# Patient Record
Sex: Female | Born: 1980 | Race: White | Hispanic: No | Marital: Single | Smoking: Never smoker
Health system: Southern US, Community
[De-identification: ages and names within clinical notes are randomized; demographics above are authoritative.]

## PROBLEM LIST (undated history)

## (undated) DIAGNOSIS — R569 Unspecified convulsions: Secondary | ICD-10-CM

## (undated) DIAGNOSIS — F71 Moderate intellectual disabilities: Secondary | ICD-10-CM

## (undated) DIAGNOSIS — F39 Unspecified mood [affective] disorder: Secondary | ICD-10-CM

---
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
## 2004-06-13 ENCOUNTER — Emergency Department (HOSPITAL_COMMUNITY): Admission: EM | Admit: 2004-06-13 | Discharge: 2004-06-13 | Payer: Self-pay | Admitting: Family Medicine

## 2004-06-28 ENCOUNTER — Emergency Department (HOSPITAL_COMMUNITY): Admission: EM | Admit: 2004-06-28 | Discharge: 2004-06-28 | Payer: Self-pay | Admitting: Emergency Medicine

## 2004-07-13 ENCOUNTER — Ambulatory Visit: Payer: Self-pay | Admitting: Internal Medicine

## 2004-08-04 ENCOUNTER — Ambulatory Visit: Payer: Self-pay | Admitting: Internal Medicine

## 2004-08-31 ENCOUNTER — Ambulatory Visit: Payer: Self-pay | Admitting: Internal Medicine

## 2004-10-05 ENCOUNTER — Ambulatory Visit: Payer: Self-pay | Admitting: Internal Medicine

## 2004-11-15 ENCOUNTER — Emergency Department (HOSPITAL_COMMUNITY): Admission: EM | Admit: 2004-11-15 | Discharge: 2004-11-15 | Payer: Self-pay | Admitting: Emergency Medicine

## 2005-05-21 ENCOUNTER — Ambulatory Visit: Payer: Self-pay | Admitting: Internal Medicine

## 2005-05-28 ENCOUNTER — Emergency Department (HOSPITAL_COMMUNITY): Admission: EM | Admit: 2005-05-28 | Discharge: 2005-05-28 | Payer: Self-pay | Admitting: Emergency Medicine

## 2005-05-29 ENCOUNTER — Emergency Department (HOSPITAL_COMMUNITY): Admission: EM | Admit: 2005-05-29 | Discharge: 2005-05-29 | Payer: Self-pay | Admitting: Family Medicine

## 2005-09-24 ENCOUNTER — Ambulatory Visit (HOSPITAL_COMMUNITY): Admission: RE | Admit: 2005-09-24 | Discharge: 2005-09-24 | Payer: Self-pay | Admitting: Obstetrics and Gynecology

## 2009-02-10 ENCOUNTER — Emergency Department (HOSPITAL_BASED_OUTPATIENT_CLINIC_OR_DEPARTMENT_OTHER): Admission: EM | Admit: 2009-02-10 | Discharge: 2009-02-10 | Payer: Self-pay | Admitting: Emergency Medicine

## 2010-03-30 LAB — BASIC METABOLIC PANEL
BUN: 11 mg/dL (ref 6–23)
CO2: 25 mEq/L (ref 19–32)
Calcium: 8.7 mg/dL (ref 8.4–10.5)
Chloride: 108 mEq/L (ref 96–112)
Creatinine, Ser: 0.6 mg/dL (ref 0.4–1.2)
GFR calc Af Amer: >60 mL/min (ref >60)
GFR calc non Af Amer: >60 mL/min (ref >60)
Glucose, Bld: 87 mg/dL (ref 70–99)
Potassium: 3.8 mEq/L (ref 3.5–5.1)
Sodium: 144 mEq/L (ref 135–145)

## 2010-03-30 LAB — LIPASE, BLOOD: Lipase: 92 U/L (ref 23–300)

## 2010-03-30 LAB — CBC
HCT: 40.2 % (ref 36.0–46.0)
Hemoglobin: 13.9 g/dL (ref 12.0–15.0)
MCHC: 34.6 g/dL (ref 30.0–36.0)
MCV: 89.1 fL (ref 78.0–100.0)
Platelets: 259 10*3/uL (ref 150–400)
RBC: 4.51 MIL/uL (ref 3.87–5.11)
RDW: 13.3 % (ref 11.5–15.5)
WBC: 9.2 10*3/uL (ref 4.0–10.5)

## 2010-03-30 LAB — URINALYSIS, ROUTINE W REFLEX MICROSCOPIC
Glucose, UA: NEGATIVE mg/dL
Hgb urine dipstick: NEGATIVE
Ketones, ur: NEGATIVE mg/dL
Nitrite: NEGATIVE
Protein, ur: NEGATIVE mg/dL
Specific Gravity, Urine: 1.029 (ref 1.005–1.030)
Urobilinogen, UA: 1 mg/dL (ref 0.0–1.0)
pH: 5.5 (ref 5.0–8.0)

## 2010-03-30 LAB — DIFFERENTIAL
Basophils Absolute: 0.1 10*3/uL (ref 0.0–0.1)
Basophils Relative: 1 % (ref 0–1)
Eosinophils Absolute: 0 10*3/uL (ref 0.0–0.7)
Eosinophils Relative: 1 % (ref 0–5)
Lymphocytes Relative: 16 % (ref 12–46)
Lymphs Abs: 1.5 10*3/uL (ref 0.7–4.0)
Monocytes Absolute: 0.7 10*3/uL (ref 0.1–1.0)
Monocytes Relative: 8 % (ref 3–12)
Neutro Abs: 6.9 10*3/uL (ref 1.7–7.7)
Neutrophils Relative %: 75 % (ref 43–77)

## 2010-03-30 LAB — HEPATIC FUNCTION PANEL
ALT: 14 U/L (ref 0–35)
AST: 27 U/L (ref 0–37)
Albumin: 4.3 g/dL (ref 3.5–5.2)
Alkaline Phosphatase: 42 U/L (ref 39–117)
Bilirubin, Direct: 0 mg/dL (ref 0.0–0.3)
Indirect Bilirubin: 0.4 mg/dL (ref 0.3–0.9)
Total Bilirubin: 0.4 mg/dL (ref 0.3–1.2)
Total Protein: 7.8 g/dL (ref 6.0–8.3)

## 2010-03-30 LAB — PREGNANCY, URINE: Preg Test, Ur: NEGATIVE

## 2015-11-04 ENCOUNTER — Encounter (HOSPITAL_COMMUNITY): Payer: Self-pay | Admitting: Emergency Medicine

## 2015-11-04 ENCOUNTER — Emergency Department (HOSPITAL_COMMUNITY)
Admission: EM | Admit: 2015-11-04 | Discharge: 2015-11-04 | Disposition: A | Discharge status: Home / Self Care | Payer: No Typology Code available for payment source | Attending: Emergency Medicine | Admitting: Emergency Medicine

## 2015-11-04 ENCOUNTER — Emergency Department (HOSPITAL_COMMUNITY): Payer: No Typology Code available for payment source

## 2015-11-04 DIAGNOSIS — Y939 Activity, unspecified: Secondary | ICD-10-CM | POA: Diagnosis not present

## 2015-11-04 DIAGNOSIS — M549 Dorsalgia, unspecified: Secondary | ICD-10-CM | POA: Diagnosis not present

## 2015-11-04 DIAGNOSIS — Y999 Unspecified external cause status: Secondary | ICD-10-CM | POA: Diagnosis not present

## 2015-11-04 DIAGNOSIS — M542 Cervicalgia: Secondary | ICD-10-CM | POA: Diagnosis present

## 2015-11-04 DIAGNOSIS — Y92410 Unspecified street and highway as the place of occurrence of the external cause: Secondary | ICD-10-CM | POA: Insufficient documentation

## 2015-11-04 HISTORY — DX: Unspecified convulsions: R56.9

## 2015-11-04 MED ORDER — ACETAMINOPHEN 500 MG PO TABS: 500.0000 mg | ORAL_TABLET | Freq: Four times a day (QID) | ORAL | 0 refills | Status: DC | PRN

## 2015-11-04 MED ORDER — IBUPROFEN 800 MG PO TABS
800.0000 mg | ORAL_TABLET | Freq: Once | ORAL | Status: AC
  Administered 2015-11-04: 800 mg via ORAL
  Filled 2015-11-04: qty 1

## 2015-11-04 MED ORDER — CYCLOBENZAPRINE HCL 10 MG PO TABS: 10.0000 mg | ORAL_TABLET | Freq: Two times a day (BID) | ORAL | 0 refills | Status: DC | PRN
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
## 2015-11-04 NOTE — ED Notes (Signed)
Caregiver states that pt normally on 800mg  of ibuprofen and don't think it will help her pain.  Greta DoomBowie, PA made aware.
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
## 2016-09-19 ENCOUNTER — Emergency Department (HOSPITAL_COMMUNITY): Payer: Medicare Other

## 2016-09-19 ENCOUNTER — Observation Stay (HOSPITAL_COMMUNITY)
Admission: EM | Admit: 2016-09-19 | Discharge: 2016-09-20 | Disposition: A | Discharge status: Home / Self Care | Payer: Medicare Other | Attending: Internal Medicine | Admitting: Internal Medicine

## 2016-09-19 ENCOUNTER — Encounter (HOSPITAL_COMMUNITY): Payer: Self-pay

## 2016-09-19 DIAGNOSIS — F71 Moderate intellectual disabilities: Secondary | ICD-10-CM | POA: Diagnosis not present

## 2016-09-19 DIAGNOSIS — F319 Bipolar disorder, unspecified: Secondary | ICD-10-CM | POA: Insufficient documentation

## 2016-09-19 DIAGNOSIS — W2209XA Striking against other stationary object, initial encounter: Secondary | ICD-10-CM | POA: Diagnosis not present

## 2016-09-19 DIAGNOSIS — G40909 Epilepsy, unspecified, not intractable, without status epilepticus: Secondary | ICD-10-CM | POA: Diagnosis not present

## 2016-09-19 DIAGNOSIS — I10 Essential (primary) hypertension: Secondary | ICD-10-CM | POA: Diagnosis not present

## 2016-09-19 DIAGNOSIS — Z820 Family history of epilepsy and other diseases of the nervous system: Secondary | ICD-10-CM | POA: Insufficient documentation

## 2016-09-19 DIAGNOSIS — W1839XA Other fall on same level, initial encounter: Secondary | ICD-10-CM | POA: Insufficient documentation

## 2016-09-19 DIAGNOSIS — Z79899 Other long term (current) drug therapy: Secondary | ICD-10-CM | POA: Insufficient documentation

## 2016-09-19 DIAGNOSIS — S01411A Laceration without foreign body of right cheek and temporomandibular area, initial encounter: Secondary | ICD-10-CM | POA: Diagnosis not present

## 2016-09-19 DIAGNOSIS — S0181XA Laceration without foreign body of other part of head, initial encounter: Secondary | ICD-10-CM

## 2016-09-19 DIAGNOSIS — S0990XA Unspecified injury of head, initial encounter: Secondary | ICD-10-CM

## 2016-09-19 DIAGNOSIS — R569 Unspecified convulsions: Secondary | ICD-10-CM | POA: Diagnosis not present

## 2016-09-19 HISTORY — DX: Unspecified mood (affective) disorder: F39

## 2016-09-19 HISTORY — DX: Moderate intellectual disabilities: F71

## 2016-09-19 LAB — CBC WITH DIFFERENTIAL/PLATELET
Basophils Absolute: 0.1 10*3/uL (ref 0.0–0.1)
Basophils Relative: 1 %
Eosinophils Absolute: 0.1 10*3/uL (ref 0.0–0.7)
Eosinophils Relative: 1 %
HCT: 42.1 % (ref 36.0–46.0)
Hemoglobin: 14.4 g/dL (ref 12.0–15.0)
LYMPHS ABS: 3 10*3/uL (ref 0.7–4.0)
Lymphocytes Relative: 27 %
MCH: 28.5 pg (ref 26.0–34.0)
MCHC: 34.2 g/dL (ref 30.0–36.0)
MCV: 83.4 fL (ref 78.0–100.0)
Monocytes Absolute: 0.8 10*3/uL (ref 0.1–1.0)
Monocytes Relative: 7 %
NEUTROS ABS: 7 10*3/uL (ref 1.7–7.7)
Neutrophils Relative %: 64 %
Platelets: 291 10*3/uL (ref 150–400)
RBC: 5.05 MIL/uL (ref 3.87–5.11)
RDW: 13.9 % (ref 11.5–15.5)
WBC: 10.9 10*3/uL — ABNORMAL HIGH (ref 4.0–10.5)

## 2016-09-19 LAB — COMPREHENSIVE METABOLIC PANEL
ALT: 21 U/L (ref 14–54)
AST: 20 U/L (ref 15–41)
Albumin: 4.3 g/dL (ref 3.5–5.0)
Alkaline Phosphatase: 54 U/L (ref 38–126)
Anion gap: 9 (ref 5–15)
BUN: 11 mg/dL (ref 6–20)
CO2: 24 mmol/L (ref 22–32)
Calcium: 9.9 mg/dL (ref 8.9–10.3)
Chloride: 109 mmol/L (ref 101–111)
Creatinine, Ser: 0.74 mg/dL (ref 0.44–1.00)
GFR calc Af Amer: >60 mL/min (ref >60)
GFR calc non Af Amer: >60 mL/min (ref >60)
Glucose, Bld: 88 mg/dL (ref 65–99)
Potassium: 3.5 mmol/L (ref 3.5–5.1)
SODIUM: 142 mmol/L (ref 135–145)
Total Bilirubin: 0.3 mg/dL (ref 0.3–1.2)
Total Protein: 7.5 g/dL (ref 6.5–8.1)

## 2016-09-19 LAB — I-STAT BETA HCG BLOOD, ED (MC, WL, AP ONLY): I-stat hCG, quantitative: <5 m[IU]/mL (ref <5)

## 2016-09-19 LAB — ETHANOL: Alcohol, Ethyl (B): <5 mg/dL (ref <5)

## 2016-09-19 LAB — SALICYLATE LEVEL: Salicylate Lvl: <7 mg/dL (ref 2.8–30.0)

## 2016-09-19 MED ORDER — LEVETIRACETAM 500 MG PO TABS
750.0000 mg | ORAL_TABLET | Freq: Two times a day (BID) | ORAL | Status: DC
  Administered 2016-09-20: 09:00:00 750 mg via ORAL
  Filled 2016-09-19 (×2): qty 1

## 2016-09-19 MED ORDER — LORAZEPAM 2 MG/ML IJ SOLN: 2.0000 mg | INTRAMUSCULAR | Status: DC | PRN

## 2016-09-19 MED ORDER — LIDOCAINE-EPINEPHRINE-TETRACAINE (LET) SOLUTION
3.0000 mL | Freq: Once | NASAL | Status: AC
  Administered 2016-09-19: 18:00:00 3 mL via TOPICAL
  Filled 2016-09-19: qty 3

## 2016-09-19 MED ORDER — ONDANSETRON HCL 4 MG PO TABS: 4.0000 mg | ORAL_TABLET | Freq: Four times a day (QID) | ORAL | Status: DC | PRN

## 2016-09-19 MED ORDER — SODIUM CHLORIDE 0.9% FLUSH
3.0000 mL | Freq: Two times a day (BID) | INTRAVENOUS | Status: DC
  Administered 2016-09-19 – 2016-09-20 (×2): 3 mL via INTRAVENOUS

## 2016-09-19 MED ORDER — SODIUM CHLORIDE 0.9% FLUSH: 3.0000 mL | INTRAVENOUS | Status: DC | PRN

## 2016-09-19 MED ORDER — SODIUM CHLORIDE 0.9 % IV SOLN
1000.0000 mg | Freq: Once | INTRAVENOUS | Status: AC
  Administered 2016-09-19: 1000 mg via INTRAVENOUS
  Filled 2016-09-19: qty 10

## 2016-09-19 MED ORDER — FUROSEMIDE 40 MG PO TABS
20.0000 mg | ORAL_TABLET | Freq: Every day | ORAL | Status: DC
  Administered 2016-09-20: 20 mg via ORAL
  Filled 2016-09-19: qty 1

## 2016-09-19 MED ORDER — GEMFIBROZIL 600 MG PO TABS
600.0000 mg | ORAL_TABLET | Freq: Every day | ORAL | Status: DC
  Filled 2016-09-19: qty 1

## 2016-09-19 MED ORDER — LORATADINE 10 MG PO TABS
10.0000 mg | ORAL_TABLET | Freq: Every day | ORAL | Status: DC
  Administered 2016-09-20: 10 mg via ORAL
  Filled 2016-09-19: qty 1

## 2016-09-19 MED ORDER — SODIUM CHLORIDE 0.9 % IV SOLN: 250.0000 mL | INTRAVENOUS | Status: DC | PRN

## 2016-09-19 MED ORDER — BUPIVACAINE-EPINEPHRINE (PF) 0.5% -1:200000 IJ SOLN
10.0000 mL | Freq: Once | INTRAMUSCULAR | Status: AC
  Administered 2016-09-19: 10 mL
  Filled 2016-09-19: qty 30

## 2016-09-19 MED ORDER — ACETAMINOPHEN 500 MG PO TABS: 500.0000 mg | ORAL_TABLET | Freq: Four times a day (QID) | ORAL | Status: DC | PRN

## 2016-09-19 MED ORDER — INFLUENZA VAC SPLIT QUAD 0.5 ML IM SUSY: 0.5000 mL | PREFILLED_SYRINGE | INTRAMUSCULAR | Status: DC

## 2016-09-19 MED ORDER — ENOXAPARIN SODIUM 40 MG/0.4ML ~~LOC~~ SOLN
40.0000 mg | Freq: Every day | SUBCUTANEOUS | Status: DC
  Administered 2016-09-19: 40 mg via SUBCUTANEOUS
  Filled 2016-09-19: qty 0.4

## 2016-09-19 MED ORDER — POLYETHYLENE GLYCOL 3350 17 G PO PACK: 17.0000 g | PACK | Freq: Every day | ORAL | Status: DC | PRN

## 2016-09-19 MED ORDER — HYDROXYZINE HCL 25 MG PO TABS
25.0000 mg | ORAL_TABLET | Freq: Every day | ORAL | Status: DC
  Administered 2016-09-20: 25 mg via ORAL
  Filled 2016-09-19: qty 1

## 2016-09-19 MED ORDER — HYDROXYZINE HCL 25 MG PO TABS
75.0000 mg | ORAL_TABLET | Freq: Every day | ORAL | Status: DC
  Administered 2016-09-19: 75 mg via ORAL
  Filled 2016-09-19: qty 3

## 2016-09-19 MED ORDER — ARIPIPRAZOLE 10 MG PO TABS
10.0000 mg | ORAL_TABLET | Freq: Every day | ORAL | Status: DC
  Administered 2016-09-20: 10 mg via ORAL
  Filled 2016-09-19: qty 1

## 2016-09-19 MED ORDER — PANTOPRAZOLE SODIUM 40 MG PO TBEC
40.0000 mg | DELAYED_RELEASE_TABLET | Freq: Every day | ORAL | Status: DC
  Administered 2016-09-19 – 2016-09-20 (×2): 40 mg via ORAL
  Filled 2016-09-19 (×2): qty 1

## 2016-09-19 MED ORDER — LACOSAMIDE 50 MG PO TABS
150.0000 mg | ORAL_TABLET | Freq: Two times a day (BID) | ORAL | Status: DC
  Administered 2016-09-19 – 2016-09-20 (×2): 150 mg via ORAL
  Filled 2016-09-19 (×2): qty 3

## 2016-09-19 MED ORDER — FLUTICASONE PROPIONATE 50 MCG/ACT NA SUSP
1.0000 | Freq: Every day | NASAL | Status: DC | PRN
  Filled 2016-09-19: qty 16

## 2016-09-19 MED ORDER — LORAZEPAM 2 MG/ML IJ SOLN
INTRAMUSCULAR | Status: AC
  Administered 2016-09-19: 2 mg
  Filled 2016-09-19: qty 1

## 2016-09-19 MED ORDER — ACETAMINOPHEN 325 MG PO TABS: 650.0000 mg | ORAL_TABLET | Freq: Once | ORAL | Status: DC

## 2016-09-19 MED ORDER — ONDANSETRON HCL 4 MG/2ML IJ SOLN: 4.0000 mg | Freq: Four times a day (QID) | INTRAMUSCULAR | Status: DC | PRN

## 2016-09-19 NOTE — ED Notes (Signed)
Pt experienced a seizure in x-ray. EDP came to bedside and ordered  ativan IV which was administered. Seizure activity was witnessed to last about 3 min. Pt is currently in postictal state

## 2016-09-19 NOTE — ED Triage Notes (Signed)
She was seen to have a tonic-clonic seizure whilst shopping at a local retailer with subsequent fall. EMS found pt. To be drowsy and in no distress upon their arrival. A facial lac. At right angle-of-jaw area was also noted by them; and it appeared pt. Struck the edge of a shelf when she fell, thereby causing the lac. She is awake, alert and oriented x 3 and tells me she is "hungry".

## 2016-09-19 NOTE — ED Provider Notes (Signed)
[facility]-EMERGENCY DEPT Provider Note   CSN: [id number] Arrival date & time: 09/19/16  1308     History   Chief Complaint Chief Complaint  Patient presents with  . Seizures  . Fall  . Facial Laceration    HPI Kristi King is a 36 y.o. female.  HPI   Kristi King is a 36 y.o. female, with a history of Moderate intellectual developmental disorder, seizures, and mood disorder, presenting to the ED with reported seizure shortly prior to arrival. Positive urinary incontinence and tongue trauma. Patient states her last seizure was in May 2018. Currently complains of right facial pain and posterior neck pain, both 7/10, vague description, nonradiating. Tameka, job Psychologist, occupational (helps patient with tasks at work, helps her learn tasks, keeps her focused), is at the bedside. States patient was acting normally prior to seizure. Was mopping when she stiffened, fell over, hit her head on a cabinet, and seized on the ground for 35-40 seconds. No noted vomiting. Missed her keppra dose today because it had to be renewed. Patient denies chest pain, shortness of breath, abdominal pain, neuro deficits, or any other complaints.   Past Medical History:  Diagnosis Date  . Moderate intellectual disability   . Mood disorder (HCC)   . Seizures [facility])     Patient Active Problem List   Diagnosis Date Noted  . Seizure (HCC) 09/19/2016    History reviewed. No pertinent surgical history.  OB History    No data available       Home Medications    Prior to Admission medications   Medication Sig Start Date End Date Taking? Authorizing Provider  acetaminophen (TYLENOL) 500 MG tablet Take 1 tablet (500 mg total) by mouth every 6 (six) hours as needed. 11/04/15  Yes Fayrene Helper, PA-C  ARIPiprazole (ABILIFY) 10 MG tablet Take 10 mg by mouth daily. 09/18/16  Yes [provider]  cetirizine (ZYRTEC) 10 MG tablet Take 10 mg by mouth daily. 09/11/16  Yes [provider]  esomeprazole (NEXIUM) 40  MG capsule Take 40 mg by mouth at bedtime. 08/28/16  Yes [provider]  fluticasone (FLONASE) 50 MCG/ACT nasal spray Place 1 spray into the nose daily as needed.   Yes [provider]  furosemide (LASIX) 20 MG tablet Take 20 mg by mouth daily. 09/18/16  Yes [provider]  gemfibrozil (LOPID) 600 MG tablet Take 600 mg by mouth at bedtime. 09/11/16  Yes [provider]  hydrOXYzine (VISTARIL) 25 MG capsule Take 25-75 mg by mouth 2 (two) times daily. 1 qam and 1 qhs 08/19/16  Yes [provider]  levETIRAcetam (KEPPRA) 750 MG tablet Take 750 mg by mouth 2 (two) times daily. 09/19/16  Yes [provider]  Melatonin 10 MG TABS Take 10 mg by mouth at bedtime.   Yes [provider]  Omega-3 Fatty Acids (FISH OIL OMEGA-3) 1000 MG CAPS Take 1,000 mg by mouth 2 (two) times daily.   Yes [provider]  tiZANidine (ZANAFLEX) 4 MG tablet Take 4 mg by mouth 2 (two) times daily. 09/19/16  Yes [provider]  VIMPAT 150 MG TABS Take 150 mg by mouth 2 (two) times daily. 08/21/16  Yes [provider]  Vitamin D, Ergocalciferol, (DRISDOL) 50000 units CAPS capsule Take 1 capsule by mouth once a week. 09/07/16  Yes [provider]    Family History Family History  Problem Relation Age of Onset  . Epilepsy Mother     Social History Social  History  Substance Use Topics  . Smoking status: Never Smoker  . Smokeless tobacco: Never Used  . Alcohol use No     Allergies   Dilantin [phenytoin] and Tegretol [carbamazepine]   Review of Systems Review of Systems  Constitutional: Negative for chills, diaphoresis and fever.  HENT: Positive for facial swelling.   Respiratory: Negative for shortness of breath.   Cardiovascular: Negative for chest pain.  Gastrointestinal: Negative for abdominal pain, nausea and vomiting.  Musculoskeletal: Positive for neck pain.  Skin: Positive for wound.  Neurological: Positive for  seizures. Negative for weakness, numbness and headaches.  All other systems reviewed and are negative.    Physical Exam Updated Vital Signs BP 119/73 (BP Location: Right Arm)   Pulse 85   Temp 97.8 F (36.6 C) (Oral)   Resp 18   SpO2 97%   Physical Exam  Constitutional: She appears well-developed and well-nourished. No distress.  HENT:  Head: Normocephalic.  Tenderness and swelling to right posterior mandible. Jaw opening limited to about 3 cm. Difficulty with jaw opening was corrected following administration of Tylenol. Fully tested following clear maxillofacial CT. Abrasion to the right anterior tongue. No noted laceration or repairable wound. Dentition appears to be intact, and patient agrees.  Eyes: Pupils are equal, round, and reactive to light. Conjunctivae and EOM are normal.  Neck: Neck supple.  Cardiovascular: Normal rate, regular rhythm, normal heart sounds and intact distal pulses.   Pulmonary/Chest: Effort normal and breath sounds normal. No respiratory distress.  Abdominal: Soft. There is no tenderness. There is no guarding.  Genitourinary:  Genitourinary Comments: Urinary incontinence.   Musculoskeletal: She exhibits no edema.  Midline cervical spine tenderness without deformity, step-off, swelling, or crepitus. c-collar in place upon initial exam. No other midline spinal tenderness.   Abrasion to right lateral shoulder. Full range of motion in the bilateral shoulders, elbows, and wrists. Pain with range of motion in the right shoulder and elbow. No noted swelling, crepitus, or deformity.  Normal motor function in the lower extremities.   Lymphadenopathy:    She has no cervical adenopathy.  Neurological: She is alert.  Skin: Skin is warm and dry. Capillary refill takes less than 2 seconds. She is not diaphoretic.  5 cm laceration to the right side of the face. Bleeding controlled prior to arrival. Laceration into the dermis. Does not appear to involve the muscle  layer. 2 cm, superficial-appearing laceration to the right side of the face.  Psychiatric: She has a normal mood and affect. Her behavior is normal.  Nursing note and vitals reviewed.            ED Treatments / Results  Labs (all labs ordered are listed, but only abnormal results are displayed) Labs Reviewed  CBC WITH DIFFERENTIAL/PLATELET - Abnormal; Notable for the following:       Result Value   WBC 10.9 (*)    All other components within normal limits  COMPREHENSIVE METABOLIC PANEL  ETHANOL  SALICYLATE LEVEL  URINALYSIS, ROUTINE W REFLEX MICROSCOPIC  RAPID URINE DRUG SCREEN, HOSP PERFORMED  LEVETIRACETAM LEVEL  HIV ANTIBODY (ROUTINE TESTING)  CBC  CREATININE, SERUM  CBC  BASIC METABOLIC PANEL  CBG MONITORING, ED  I-STAT BETA HCG BLOOD, ED ([facility], [facility], [facility] ONLY)    EKG  EKG Interpretation None       Radiology Dg Shoulder Right  Result Date: 09/19/2016 CLINICAL DATA:  Fall.  Tenderness and pain.  Seizures. EXAM: RIGHT SHOULDER - 2+ VIEW COMPARISON:  None. FINDINGS:  There is no evidence of fracture or dislocation. There is no evidence of arthropathy or other focal bone abnormality. Soft tissues are unremarkable. IMPRESSION: Negative right shoulder radiographs. Electronically Signed   By: Marin Robertshristopher  Mattern M.D.   On: 09/19/2016 16:37   Dg Elbow Complete Right  Result Date: 09/19/2016 CLINICAL DATA:  Fall.  Seizures.  Tenderness and pain. EXAM: RIGHT ELBOW - COMPLETE 3+ VIEW COMPARISON:  None. FINDINGS: Right shoulder scratched at the right elbow is located. No acute fracture is seen. A small elbow effusion is suggested. Soft tissue swelling is noted over the olecranon. IMPRESSION: 1. Probable elbow effusion.  This suggests an occult fracture. 2. No focal fracture is evident. Electronically Signed   By: Marin Robertshristopher  Mattern M.D.   On: 09/19/2016 16:38   Ct Head Wo Contrast  Result Date: 09/19/2016 CLINICAL DATA:  Head trauma after seizure. EXAM: CT HEAD WITHOUT  CONTRAST CT MAXILLOFACIAL WITHOUT CONTRAST CT CERVICAL SPINE WITHOUT CONTRAST TECHNIQUE: Multidetector CT imaging of the head, cervical spine, and maxillofacial structures were performed using the standard protocol without intravenous contrast. Multiplanar CT image reconstructions of the cervical spine and maxillofacial structures were also generated. COMPARISON:  CT head dated dated October 16, 2015. FINDINGS: CT HEAD FINDINGS Brain: No evidence of acute infarction, hemorrhage, hydrocephalus, extra-axial collection or mass lesion/mass effect. Vascular: No hyperdense vessel or unexpected calcification. Skull: Normal. Negative for fracture or focal lesion. Other: None. CT MAXILLOFACIAL FINDINGS Osseous: No fracture or mandibular dislocation. No destructive process. Orbits: Negative. No traumatic or inflammatory finding. Sinuses: Clear. Soft tissues: Negative. CT CERVICAL SPINE FINDINGS Alignment: Normal. Skull base and vertebrae: No acute fracture. No primary bone lesion or focal pathologic process. Soft tissues and spinal canal: No prevertebral fluid or swelling. No visible canal hematoma. Disc levels:  Minimal degenerative changes. Upper chest: Negative. Other: None. IMPRESSION: 1.  No acute intracranial abnormality. 2. No acute facial fracture. 3.  No acute cervical spine fracture. Electronically Signed   By: Obie DredgeWilliam T Derry M.D.   On: 09/19/2016 15:50   Ct Cervical Spine Wo Contrast  Result Date: 09/19/2016 CLINICAL DATA:  Head trauma after seizure. EXAM: CT HEAD WITHOUT CONTRAST CT MAXILLOFACIAL WITHOUT CONTRAST CT CERVICAL SPINE WITHOUT CONTRAST TECHNIQUE: Multidetector CT imaging of the head, cervical spine, and maxillofacial structures were performed using the standard protocol without intravenous contrast. Multiplanar CT image reconstructions of the cervical spine and maxillofacial structures were also generated. COMPARISON:  CT head dated dated October 16, 2015. FINDINGS: CT HEAD FINDINGS Brain: No  evidence of acute infarction, hemorrhage, hydrocephalus, extra-axial collection or mass lesion/mass effect. Vascular: No hyperdense vessel or unexpected calcification. Skull: Normal. Negative for fracture or focal lesion. Other: None. CT MAXILLOFACIAL FINDINGS Osseous: No fracture or mandibular dislocation. No destructive process. Orbits: Negative. No traumatic or inflammatory finding. Sinuses: Clear. Soft tissues: Negative. CT CERVICAL SPINE FINDINGS Alignment: Normal. Skull base and vertebrae: No acute fracture. No primary bone lesion or focal pathologic process. Soft tissues and spinal canal: No prevertebral fluid or swelling. No visible canal hematoma. Disc levels:  Minimal degenerative changes. Upper chest: Negative. Other: None. IMPRESSION: 1.  No acute intracranial abnormality. 2. No acute facial fracture. 3.  No acute cervical spine fracture. Electronically Signed   By: Obie DredgeWilliam T Derry M.D.   On: 09/19/2016 15:50   Ct Maxillofacial Wo Contrast  Result Date: 09/19/2016 CLINICAL DATA:  Head trauma after seizure. EXAM: CT HEAD WITHOUT CONTRAST CT MAXILLOFACIAL WITHOUT CONTRAST CT CERVICAL SPINE WITHOUT CONTRAST TECHNIQUE: Multidetector CT imaging of the head, cervical  spine, and maxillofacial structures were performed using the standard protocol without intravenous contrast. Multiplanar CT image reconstructions of the cervical spine and maxillofacial structures were also generated. COMPARISON:  CT head dated dated October 16, 2015. FINDINGS: CT HEAD FINDINGS Brain: No evidence of acute infarction, hemorrhage, hydrocephalus, extra-axial collection or mass lesion/mass effect. Vascular: No hyperdense vessel or unexpected calcification. Skull: Normal. Negative for fracture or focal lesion. Other: None. CT MAXILLOFACIAL FINDINGS Osseous: No fracture or mandibular dislocation. No destructive process. Orbits: Negative. No traumatic or inflammatory finding. Sinuses: Clear. Soft tissues: Negative. CT CERVICAL SPINE  FINDINGS Alignment: Normal. Skull base and vertebrae: No acute fracture. No primary bone lesion or focal pathologic process. Soft tissues and spinal canal: No prevertebral fluid or swelling. No visible canal hematoma. Disc levels:  Minimal degenerative changes. Upper chest: Negative. Other: None. IMPRESSION: 1.  No acute intracranial abnormality. 2. No acute facial fracture. 3.  No acute cervical spine fracture. Electronically Signed   By: Obie Dredge M.D.   On: 09/19/2016 15:50    Procedures .Marland KitchenLaceration Repair Date/Time: 09/19/2016 7:20 PM Performed by: Anselm Pancoast Authorized by: Anselm Pancoast   Consent:    Consent obtained:  Verbal   Consent given by:  Patient   Risks discussed:  Pain, poor cosmetic result, poor wound healing, need for additional repair and infection Anesthesia (see MAR for exact dosages):    Anesthesia method:  Local infiltration and topical application   Topical anesthetic:  LET   Local anesthetic:  Bupivacaine 0.5% WITH epi Laceration details:    Location:  Face   Face location:  R cheek   Length (cm):  5 Repair type:    Repair type:  Complex Pre-procedure details:    Preparation:  Patient was prepped and draped in usual sterile fashion and imaging obtained to evaluate for foreign bodies Exploration:    Limited defect created (wound extended): yes     Hemostasis achieved with:  LET and epinephrine   Wound exploration: wound explored through full range of motion and entire depth of wound probed and visualized   Treatment:    Area cleansed with:  Betadine and saline   Amount of cleaning:  Extensive   Irrigation solution:  Sterile saline   Irrigation volume:  500   Irrigation method:  Syringe   Debridement:  Moderate   Undermining:  Minimal Subcutaneous repair:    Suture size:  5-0   Suture material:  Vicryl (Rapide)   Suture technique:  Figure eight   Number of sutures:  13 Skin repair:    Repair method:  Tissue adhesive Approximation:     Approximation:  Close Post-procedure details:    Dressing:  Open (no dressing)   Patient tolerance of procedure:  Tolerated well, no immediate complications .Marland KitchenLaceration Repair Date/Time: 09/19/2016 7:00 PM Performed by: Anselm Pancoast Authorized by: Anselm Pancoast   Consent:    Consent obtained:  Verbal   Consent given by:  Patient   Risks discussed:  Infection, need for additional repair, poor wound healing, poor cosmetic result and pain Anesthesia (see MAR for exact dosages):    Anesthesia method:  Local infiltration and topical application   Topical anesthetic:  LET   Local anesthetic:  Bupivacaine 0.5% WITH epi Laceration details:    Location:  Face   Face location:  R cheek   Length (cm):  2 Repair type:    Repair type:  Intermediate Pre-procedure details:    Preparation:  Patient was prepped and draped  in usual sterile fashion and imaging obtained to evaluate for foreign bodies Exploration:    Hemostasis achieved with:  LET and epinephrine   Wound exploration: wound explored through full range of motion and entire depth of wound probed and visualized   Treatment:    Area cleansed with:  Betadine and saline   Amount of cleaning:  Extensive   Irrigation solution:  Sterile saline   Irrigation volume:  100   Irrigation method:  Syringe Subcutaneous repair:    Suture size:  5-0   Suture material:  Vicryl (Rapide)   Suture technique:  Figure eight   Number of sutures:  3 Skin repair:    Repair method:  Tissue adhesive Approximation:    Approximation:  Close Post-procedure details:    Dressing:  Open (no dressing)   Patient tolerance of procedure:  Tolerated well, no immediate complications   (including critical care time)  Medications Ordered in ED Medications  acetaminophen (TYLENOL) tablet 650 mg (0 mg Oral Hold 09/19/16 1630)  ARIPiprazole (ABILIFY) tablet 10 mg (not administered)  loratadine (CLARITIN) tablet 10 mg (not administered)  pantoprazole (PROTONIX) EC  tablet 40 mg (not administered)  fluticasone (FLONASE) 50 MCG/ACT nasal spray 1 spray (not administered)  furosemide (LASIX) tablet 20 mg (not administered)  gemfibrozil (LOPID) tablet 600 mg (not administered)  hydrOXYzine (VISTARIL) capsule 25-75 mg (not administered)  levETIRAcetam (KEPPRA) tablet 750 mg (not administered)  Lacosamide TABS 150 mg (not administered)  acetaminophen (TYLENOL) tablet 500 mg (not administered)  enoxaparin (LOVENOX) injection 40 mg (not administered)  sodium chloride flush (NS) 0.9 % injection 3 mL (not administered)  sodium chloride flush (NS) 0.9 % injection 3 mL (not administered)  0.9 %  sodium chloride infusion (not administered)  polyethylene glycol (MIRALAX / GLYCOLAX) packet 17 g (not administered)  ondansetron (ZOFRAN) tablet 4 mg (not administered)    Or  ondansetron (ZOFRAN) injection 4 mg (not administered)  LORazepam (ATIVAN) injection 2 mg (not administered)  bupivacaine-epinephrine (MARCAINE W/ EPI) 0.5% -1:200000 injection 10 mL (10 mLs Infiltration Given by Other 09/19/16 1950)  LORazepam (ATIVAN) 2 MG/ML injection (2 mg  Given 09/19/16 1616)  levETIRAcetam (KEPPRA) 1,000 mg in sodium chloride 0.9 % 100 mL IVPB (0 mg Intravenous Stopped 09/19/16 1950)  lidocaine-EPINEPHrine-tetracaine (LET) solution (3 mLs Topical Given 09/19/16 1803)     Initial Impression / Assessment and Plan / ED Course  I have reviewed the triage vital signs and the nursing notes.  Pertinent labs & imaging results that were available during my care of the patient were reviewed by me and considered in my medical decision making (see chart for details).  Clinical Course as of Sep 19 2021  Wed Sep 19, 2016  1548 Patient's caregiver, Florene Route, arrived at the bedside. States patient is up to date on her tetanus. Patient can take tylenol for pain management. Prefers no narcotics.   [SJ]  1555 Notified patient having a seizure. Lasted for about 2-3 minutes. Resolved with  Ativan administration.  Patient's caregiver states that patient is very sensitive to missing even one dose of her seizure medications.  [SJ]  1712 Spoke with hospitalist, Dr. Alvino Chapel, who agrees to admit the patient.  [SJ]  1806 Keppra arrived from pharmacy and was started. I wanted to have the Keppra started before attempted the laceration repair due to alleviate some of the risk of the patient having a seizure during the repair. Now that the Keppra has been started, the sequence for laceration repair can be initiated,  including the application of LET.   [SJ]    Clinical Course User Index [SJ] Karson Reede C, PA-C    Patient presents with seizure and subsequent fall and facial injury. Patient had another seizure here in the ED. No acute abnormalities on imaging. Patient admitted for observation.  Findings and plan of care discussed with Frederick Peers, MD. Dr. Clarene Duke personally evaluated and examined this patient.   A note about the laceration repair: I wanted to have the maxillofacial CT returned prior to initiating laceration repair to assure we were not dealing with an open fracture. There were no acute abnormalities noted on any of the CT scans, however, patient then had another seizure. It was determined that due to her underlying intellectual and developmental abnormalities, a minor anxiolysis may need to be administered to facilitate the laceration repair. We then wanted to initiated Keppra and have the patient return to her baseline mental status because of the possibility of having to initiate Versed for the lac repair. We ended up being able to perform the wound repair without the Versed.   Vitals:   09/19/16 1347 09/19/16 1638 09/19/16 1745 09/19/16 1820  BP: 119/73 123/84  121/89  Pulse: 85 91 82 79  Resp: (!) 25  Temp: 97.8 F (36.6 C)     TempSrc: Oral     SpO2: 97% 95% 99% 100%     Final Clinical Impressions(s) / ED Diagnoses   Final diagnoses:  Seizure (HCC)    Injury of head, initial encounter  Facial laceration, initial encounter    New Prescriptions New Prescriptions   No medications on file     Concepcion Living 09/19/16 2027    Little, Ambrose Finland, MD 09/19/16 2206

## 2016-09-19 NOTE — ED Notes (Signed)
Pt to imaging

## 2016-09-19 NOTE — ED Notes (Signed)
EDP at bedside  
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
## 2016-09-19 NOTE — H&P (Signed)
History and Physical    Kristi King:[id number] DOB: 29-Sep-1980 DOA: 09/19/2016  PCP: No primary care provider on file.  Patient coming from: Home  Chief Complaint: Seizure and fall  HPI: BONNETTA King is a 36 y.o. female with medical history significant of epilepsy, bipolar, intellectual disability/cognitive delay, who presents after seizure episode at work. History is limited, given by caregiver at bedside and by ED staff. Patient was at work when she suffered a tonic-clonic seizure. Caregiver states that she missed her morning dose of keppra due to pharmacy stock. She was drowsy in the ED. 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 Currently, she is alert and awake, has pan-positive ROS. Per caregiver, patient is a bit of a hypochondriac and will complain of everything if asked, but she has not been ill or had any complaints prior to this seizure episode. Patient specifically complains of chest pain under her left breast which is reproducible to palpation. Also complains of hunger.   ROS unreliable due to cognitive delay.   ED Course: Labs unremarkable. CT head, cervical spine, maxillofacial without acute abnormality, negative right shoulder xray, +right elbow effusion possibly occult fracture. Suffered second seizure episode in radiology, broken with IV ativan. IV keppra loading started. Facial laceration to be repaired by EDP prior to admission.   Review of Systems: As per HPI otherwise 10 point review of systems negative.   Past Medical History:  Diagnosis Date  . Moderate intellectual disability   . Mood disorder (HCC)   . Seizures (HCC)     History reviewed. No pertinent surgical history.   reports that she has never smoked. She has never used smokeless tobacco. She reports that she does not drink alcohol or use drugs.  Allergies  Allergen Reactions  . Dilantin [Phenytoin]   . Tegretol [Carbamazepine]     Family  History  Problem Relation Age of Onset  . Epilepsy Mother     Prior to Admission medications   Medication Sig Start Date End Date Taking? Authorizing Provider  acetaminophen (TYLENOL) 500 MG tablet Take 1 tablet (500 mg total) by mouth every 6 (six) hours as needed. 11/04/15  Yes Fayrene Helper, PA-C  ARIPiprazole (ABILIFY) 10 MG tablet Take 10 mg by mouth daily. 09/18/16  Yes [provider]  cetirizine (ZYRTEC) 10 MG tablet Take 10 mg by mouth daily. 09/11/16  Yes [provider]  esomeprazole (NEXIUM) 40 MG capsule Take 40 mg by mouth at bedtime. 08/28/16  Yes [provider]  fluticasone (FLONASE) 50 MCG/ACT nasal spray Place 1 spray into the nose daily as needed.   Yes [provider]  furosemide (LASIX) 20 MG tablet Take 20 mg by mouth daily. 09/18/16  Yes [provider]  gemfibrozil (LOPID) 600 MG tablet Take 600 mg by mouth at bedtime. 09/11/16  Yes [provider]  hydrOXYzine (VISTARIL) 25 MG capsule Take 25-75 mg by mouth 2 (two) times daily. 1 qam and 1 qhs 08/19/16  Yes [provider]  levETIRAcetam (KEPPRA) 750 MG tablet Take 750 mg by mouth 2 (two) times daily. 09/19/16  Yes [provider]  Melatonin 10 MG TABS Take 10 mg by mouth at bedtime.   Yes [provider]  Omega-3 Fatty Acids (FISH OIL OMEGA-3) 1000 MG CAPS Take 1,000 mg by mouth 2 (two) times daily.   Yes [provider]  tiZANidine (ZANAFLEX) 4 MG tablet Take 4 mg by mouth 2 (two) times  daily. 09/19/16  Yes [provider]  VIMPAT 150 MG TABS Take 150 mg by mouth 2 (two) times daily. 08/21/16  Yes [provider]  Vitamin D, Ergocalciferol, (DRISDOL) 50000 units CAPS capsule Take 1 capsule by mouth once a week. 09/07/16  Yes [provider]  cyclobenzaprine (FLEXERIL) 10 MG tablet Take 1 tablet (10 mg total) by mouth 2 (two) times daily as needed for muscle spasms. Patient not taking: Reported on 09/19/2016 11/04/15    Fayrene Helperran, [place], PA-C    Physical Exam: Vitals:   09/19/16 1347 09/19/16 1638 09/19/16 1745  BP: 119/73 123/84   Pulse: 85 91 82  Resp: 18 16 17   Temp: 97.8 F (36.6 C)    TempSrc: Oral    SpO2: 97% 95% 99%     Constitutional: NAD, calm, comfortable Eyes: PERRL, lids and conjunctivae normal ENMT: Mucous membranes are moist. Posterior pharynx clear of any exudate or lesions.Normal dentition.  Neck: normal, supple, no masses, no thyromegaly Respiratory: clear to auscultation bilaterally, no wheezing, no crackles. Normal respiratory effort. No accessory muscle use.  Cardiovascular: Regular rate and rhythm, no murmurs / rubs / gallops. No extremity edema. Abdomen: no tenderness, no masses palpated. No hepatosplenomegaly. Bowel sounds positive.  Musculoskeletal: no clubbing / cyanosis. No joint deformity upper and lower extremities. Good ROM, no contractures. Normal muscle tone.  Skin: no rashes, lesions, ulcers. No induration. +right facial laceration  Neurologic: CN 2-12 grossly intact. Strength 5/5 in all 4. Speech clear.  Psychiatric: Alert and oriented   Labs on Admission: I have personally reviewed following labs and imaging studies  CBC:  Recent Labs Lab 09/19/16 1448  WBC 10.9*  NEUTROABS 7.0  HGB 14.4  HCT 42.1  MCV 83.4  PLT 291   Basic Metabolic Panel:  Recent Labs Lab 09/19/16 1448  NA 142  K 3.5  CL 109  CO2 24  GLUCOSE 88  BUN 11  CREATININE 0.74  CALCIUM 9.9   GFR: CrCl cannot be calculated (Unknown ideal weight.). Liver Function Tests:  Recent Labs Lab 09/19/16 1448  AST 20  ALT 21  ALKPHOS 54  BILITOT 0.3  PROT 7.5  ALBUMIN 4.3   No results for input(s): LIPASE, AMYLASE in the last 168 hours. No results for input(s): AMMONIA in the last 168 hours. Coagulation Profile: No results for input(s): INR, PROTIME in the last 168 hours. Cardiac Enzymes: No results for input(s): CKTOTAL, CKMB, CKMBINDEX, TROPONINI in the last 168 hours. BNP  (last 3 results) No results for input(s): PROBNP in the last 8760 hours. HbA1C: No results for input(s): HGBA1C in the last 72 hours. CBG: No results for input(s): GLUCAP in the last 168 hours. Lipid Profile: No results for input(s): CHOL, HDL, LDLCALC, TRIG, CHOLHDL, LDLDIRECT in the last 72 hours. Thyroid Function Tests: No results for input(s): TSH, T4TOTAL, FREET4, T3FREE, THYROIDAB in the last 72 hours. Anemia Panel: No results for input(s): VITAMINB12, FOLATE, FERRITIN, TIBC, IRON, RETICCTPCT in the last 72 hours. Urine analysis:    Component Value Date/Time   COLORURINE AMBER BIOCHEMICALS MAY BE AFFECTED BY COLOR (A) 02/10/2009 2245   APPEARANCEUR CLEAR 02/10/2009 2245   LABSPEC 1.029 02/10/2009 2245   PHURINE 5.5 02/10/2009 2245   GLUCOSEU NEGATIVE 02/10/2009 2245   HGBUR NEGATIVE 02/10/2009 2245   BILIRUBINUR SMALL (A) 02/10/2009 2245   KETONESUR NEGATIVE 02/10/2009 2245   PROTEINUR NEGATIVE 02/10/2009 2245   UROBILINOGEN 1.0 02/10/2009 2245   NITRITE NEGATIVE 02/10/2009 2245   LEUKOCYTESUR  02/10/2009 2245  NEGATIVE MICROSCOPIC NOT DONE ON URINES WITH NEGATIVE PROTEIN, BLOOD, LEUKOCYTES, NITRITE, OR GLUCOSE <1000 mg/dL.   Sepsis Labs: !!!!!!!!!!!!!!!!!!!!!!!!!!!!!!!!!!!!!!!!!!!! (procalcitonin:4,lacticidven:4) )No results found for this or any previous visit (from the past 240 hour(s)).   Radiological Exams on Admission: Dg Shoulder Right  Result Date: 09/19/2016 CLINICAL DATA:  Fall.  Tenderness and pain.  Seizures. EXAM: RIGHT SHOULDER - 2+ VIEW COMPARISON:  None. FINDINGS: There is no evidence of fracture or dislocation. There is no evidence of arthropathy or other focal bone abnormality. Soft tissues are unremarkable. IMPRESSION: Negative right shoulder radiographs. Electronically Signed   By: Marin Roberts M.D.   On: 09/19/2016 16:37   Dg Elbow Complete Right  Result Date: 09/19/2016 CLINICAL DATA:  Fall.  Seizures.  Tenderness and pain. EXAM:  RIGHT ELBOW - COMPLETE 3+ VIEW COMPARISON:  None. FINDINGS: Right shoulder scratched at the right elbow is located. No acute fracture is seen. A small elbow effusion is suggested. Soft tissue swelling is noted over the olecranon. IMPRESSION: 1. Probable elbow effusion.  This suggests an occult fracture. 2. No focal fracture is evident. Electronically Signed   By: Marin Roberts M.D.   On: 09/19/2016 16:38   Ct Head Wo Contrast  Result Date: 09/19/2016 CLINICAL DATA:  Head trauma after seizure. EXAM: CT HEAD WITHOUT CONTRAST CT MAXILLOFACIAL WITHOUT CONTRAST CT CERVICAL SPINE WITHOUT CONTRAST TECHNIQUE: Multidetector CT imaging of the head, cervical spine, and maxillofacial structures were performed using the standard protocol without intravenous contrast. Multiplanar CT image reconstructions of the cervical spine and maxillofacial structures were also generated. COMPARISON:  CT head dated dated October 16, 2015. FINDINGS: CT HEAD FINDINGS Brain: No evidence of acute infarction, hemorrhage, hydrocephalus, extra-axial collection or mass lesion/mass effect. Vascular: No hyperdense vessel or unexpected calcification. Skull: Normal. Negative for fracture or focal lesion. Other: None. CT MAXILLOFACIAL FINDINGS Osseous: No fracture or mandibular dislocation. No destructive process. Orbits: Negative. No traumatic or inflammatory finding. Sinuses: Clear. Soft tissues: Negative. CT CERVICAL SPINE FINDINGS Alignment: Normal. Skull base and vertebrae: No acute fracture. No primary bone lesion or focal pathologic process. Soft tissues and spinal canal: No prevertebral fluid or swelling. No visible canal hematoma. Disc levels:  Minimal degenerative changes. Upper chest: Negative. Other: None. IMPRESSION: 1.  No acute intracranial abnormality. 2. No acute facial fracture. 3.  No acute cervical spine fracture. Electronically Signed   By: Obie Dredge M.D.   On: 09/19/2016 15:50   Ct Cervical Spine Wo  Contrast  Result Date: 09/19/2016 CLINICAL DATA:  Head trauma after seizure. EXAM: CT HEAD WITHOUT CONTRAST CT MAXILLOFACIAL WITHOUT CONTRAST CT CERVICAL SPINE WITHOUT CONTRAST TECHNIQUE: Multidetector CT imaging of the head, cervical spine, and maxillofacial structures were performed using the standard protocol without intravenous contrast. Multiplanar CT image reconstructions of the cervical spine and maxillofacial structures were also generated. COMPARISON:  CT head dated dated October 16, 2015. FINDINGS: CT HEAD FINDINGS Brain: No evidence of acute infarction, hemorrhage, hydrocephalus, extra-axial collection or mass lesion/mass effect. Vascular: No hyperdense vessel or unexpected calcification. Skull: Normal. Negative for fracture or focal lesion. Other: None. CT MAXILLOFACIAL FINDINGS Osseous: No fracture or mandibular dislocation. No destructive process. Orbits: Negative. No traumatic or inflammatory finding. Sinuses: Clear. Soft tissues: Negative. CT CERVICAL SPINE FINDINGS Alignment: Normal. Skull base and vertebrae: No acute fracture. No primary bone lesion or focal pathologic process. Soft tissues and spinal canal: No prevertebral fluid or swelling. No visible canal hematoma. Disc levels:  Minimal degenerative changes. Upper chest: Negative. Other: None. IMPRESSION: 1.  No acute intracranial abnormality. 2. No acute facial fracture. 3.  No acute cervical spine fracture. Electronically Signed   By: Obie Dredge M.D.   On: 09/19/2016 15:50   Ct Maxillofacial Wo Contrast  Result Date: 09/19/2016 CLINICAL DATA:  Head trauma after seizure. EXAM: CT HEAD WITHOUT CONTRAST CT MAXILLOFACIAL WITHOUT CONTRAST CT CERVICAL SPINE WITHOUT CONTRAST TECHNIQUE: Multidetector CT imaging of the head, cervical spine, and maxillofacial structures were performed using the standard protocol without intravenous contrast. Multiplanar CT image reconstructions of the cervical spine and maxillofacial structures were also  generated. COMPARISON:  CT head dated dated October 16, 2015. FINDINGS: CT HEAD FINDINGS Brain: No evidence of acute infarction, hemorrhage, hydrocephalus, extra-axial collection or mass lesion/mass effect. Vascular: No hyperdense vessel or unexpected calcification. Skull: Normal. Negative for fracture or focal lesion. Other: None. CT MAXILLOFACIAL FINDINGS Osseous: No fracture or mandibular dislocation. No destructive process. Orbits: Negative. No traumatic or inflammatory finding. Sinuses: Clear. Soft tissues: Negative. CT CERVICAL SPINE FINDINGS Alignment: Normal. Skull base and vertebrae: No acute fracture. No primary bone lesion or focal pathologic process. Soft tissues and spinal canal: No prevertebral fluid or swelling. No visible canal hematoma. Disc levels:  Minimal degenerative changes. Upper chest: Negative. Other: None. IMPRESSION: 1.  No acute intracranial abnormality. 2. No acute facial fracture. 3.  No acute cervical spine fracture. Electronically Signed   By: Obie Dredge M.D.   On: 09/19/2016 15:50    Assessment/Plan Active Problems:   Seizure [facility])   Seizure -Breakthrough secondary to missed home dose. Unlikely to need further work up.  -  IV keppra loaded in ED, continue home keppra, vimpat  -Ativan prn IV  -Seizure precaution  -Follows with Neurology at [facility]   HTN -Continue lasix   Bipolar disorder -Continue abilify   Facial laceration  -To be repaired by EDP prior to admission   Cognitive delay   DVT prophylaxis: lovenox Code Status: full  Family Communication: caregiver at bedside  Disposition Plan: pending stabilization  Consults called: none  Admission status: observation    Severity of Illness: The appropriate patient status for this patient is OBSERVATION. Observation status is judged to be reasonable and necessary in order to provide the required intensity of service to ensure the patient's safety. The patient's presenting symptoms, physical  exam findings, and initial radiographic and laboratory data in the context of their medical condition is felt to place them at decreased risk for further clinical deterioration. Furthermore, it is anticipated that the patient will be medically stable for discharge from the hospital within 2 midnights of admission. The following factors support the patient status of observation.   " The patient's presenting symptoms include breakthrough seizure in setting of missed home dose.  " The physical exam findings unremarkable aside from right facial laceration. " The initial radiographic and laboratory data are unremarkable overall.    Noralee Stain, DO [facility] [email or web address] Password [facility] 09/19/2016, 6:21 PM

## 2016-09-20 DIAGNOSIS — R569 Unspecified convulsions: Secondary | ICD-10-CM | POA: Diagnosis not present

## 2016-09-20 DIAGNOSIS — G40909 Epilepsy, unspecified, not intractable, without status epilepticus: Secondary | ICD-10-CM | POA: Diagnosis not present

## 2016-09-20 LAB — URINALYSIS, ROUTINE W REFLEX MICROSCOPIC
BACTERIA UA: NONE SEEN
BILIRUBIN URINE: NEGATIVE
Glucose, UA: NEGATIVE mg/dL
Hgb urine dipstick: NEGATIVE
KETONES UR: NEGATIVE mg/dL
Nitrite: NEGATIVE
PH: 5 (ref 5.0–8.0)
PROTEIN: NEGATIVE mg/dL
Specific Gravity, Urine: 1.023 (ref 1.005–1.030)

## 2016-09-20 LAB — CBC
HCT: 41 % (ref 36.0–46.0)
HEMOGLOBIN: 13.9 g/dL (ref 12.0–15.0)
MCH: 28.5 pg (ref 26.0–34.0)
MCHC: 33.9 g/dL (ref 30.0–36.0)
MCV: 84.2 fL (ref 78.0–100.0)
PLATELETS: 290 10*3/uL (ref 150–400)
RBC: 4.87 MIL/uL (ref 3.87–5.11)
RDW: 14 % (ref 11.5–15.5)
WBC: 11.6 10*3/uL — ABNORMAL HIGH (ref 4.0–10.5)

## 2016-09-20 LAB — BASIC METABOLIC PANEL
Anion gap: 8 (ref 5–15)
BUN: 11 mg/dL (ref 6–20)
CALCIUM: 9.3 mg/dL (ref 8.9–10.3)
CHLORIDE: 108 mmol/L (ref 101–111)
CO2: 23 mmol/L (ref 22–32)
CREATININE: 0.62 mg/dL (ref 0.44–1.00)
Glucose, Bld: 106 mg/dL — ABNORMAL HIGH (ref 65–99)
Potassium: 3.2 mmol/L — ABNORMAL LOW (ref 3.5–5.1)
SODIUM: 139 mmol/L (ref 135–145)

## 2016-09-20 LAB — RAPID URINE DRUG SCREEN, HOSP PERFORMED
Amphetamines: NOT DETECTED
BARBITURATES: NOT DETECTED
BENZODIAZEPINES: POSITIVE — AB
COCAINE: NOT DETECTED
Opiates: NOT DETECTED
Tetrahydrocannabinol: NOT DETECTED

## 2016-09-20 LAB — GLUCOSE, CAPILLARY: Glucose-Capillary: 86 mg/dL (ref 65–99)

## 2016-09-20 LAB — HIV ANTIBODY (ROUTINE TESTING W REFLEX): HIV SCREEN 4TH GENERATION: NONREACTIVE

## 2016-09-20 MED ORDER — POTASSIUM CHLORIDE CRYS ER 20 MEQ PO TBCR
40.0000 meq | EXTENDED_RELEASE_TABLET | Freq: Once | ORAL | Status: AC
  Administered 2016-09-20: 40 meq via ORAL
  Filled 2016-09-20: qty 2
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
## 2016-09-20 NOTE — Progress Notes (Signed)
Date: September 20, 2016 Chart reviewed for discharge orders: None found for case management. Trayonna Bachmeier,BSN,RN3, CCM/[phone] 

## 2016-09-21 LAB — LEVETIRACETAM LEVEL: Levetiracetam Lvl: 4.3 ug/mL — ABNORMAL LOW (ref 10.0–40.0)
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
## 2018-11-26 IMAGING — CT CT CERVICAL SPINE W/O CM
4 of 11 series · 10 of 34 positions shown, 11 images · non-contrast
Comparison: CT head dated dated October 16, 2015.

CLINICAL DATA: Head trauma after seizure.

EXAM:
CT HEAD WITHOUT CONTRAST
CT MAXILLOFACIAL WITHOUT CONTRAST
CT CERVICAL SPINE WITHOUT CONTRAST
TECHNIQUE: Multidetector CT imaging of the head, cervical spine, and
maxillofacial structures were performed using the standard protocol
without intravenous contrast. Multiplanar CT image reconstructions
of the cervical spine and maxillofacial structures were also
generated.

[Series 3: facial st · axial · 0.32mm/px · z∈[+29,+77]mm · 2 of 73 slices shown, 3 images]
[im 25/73  soft-tissue]
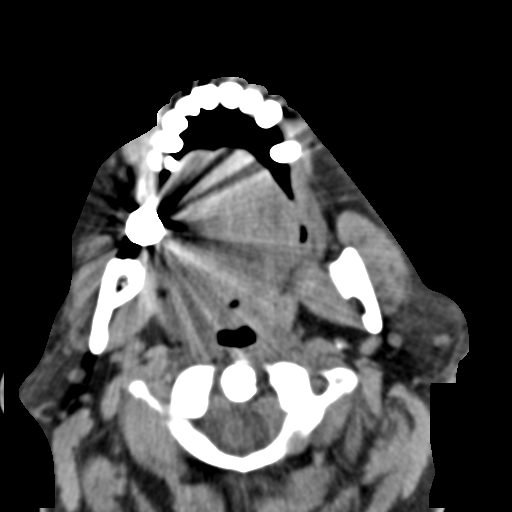
[im 25/73  bone]
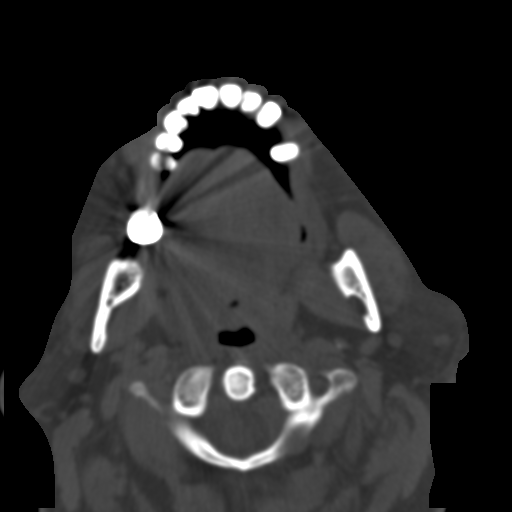
[im 49/73  bone]
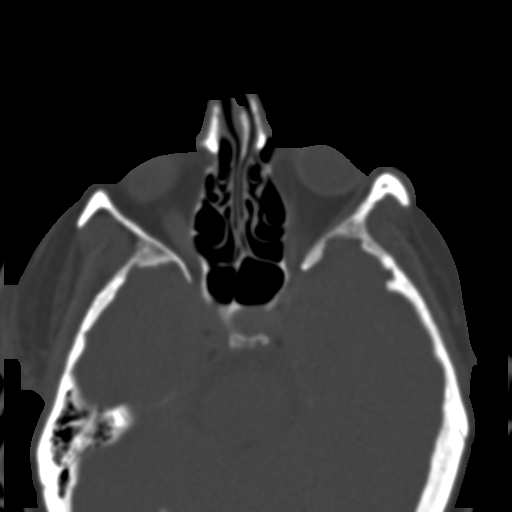

[Series 6: sagittal st · sagittal · 0.30mm/px · 4 of 88 slices shown]
[im 22/88  bone]
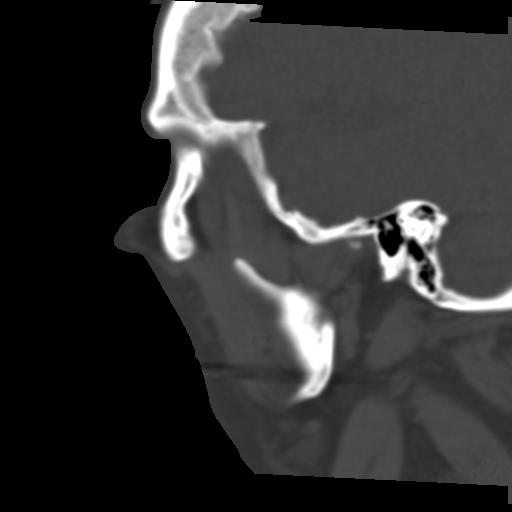
[im 44/88  bone]
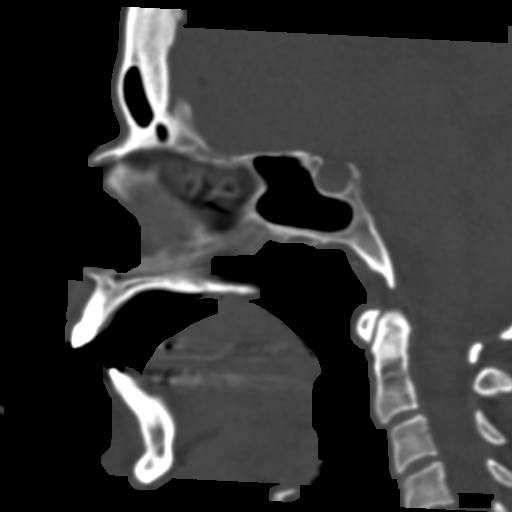
[im 59/88  soft-tissue]
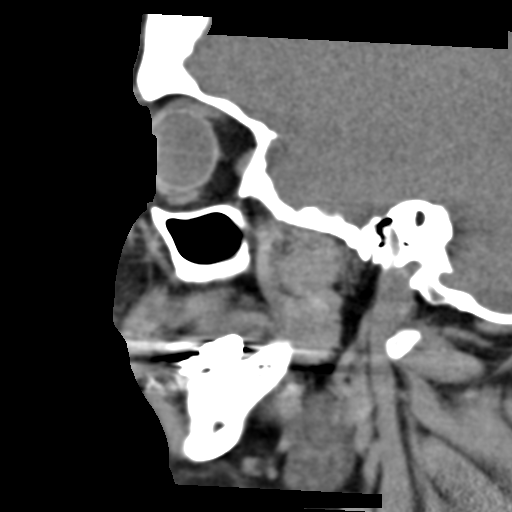
[im 66/88  bone]
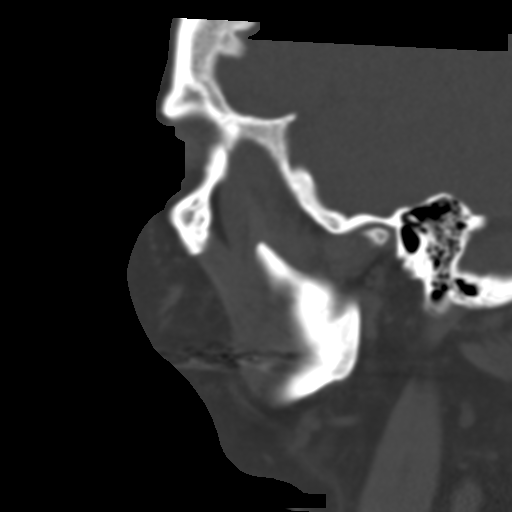

[Series 16: c-spine st · axial · 0.23mm/px · z∈[-28,+24]mm · 2 of 79 slices shown]
[im 27/79  bone]
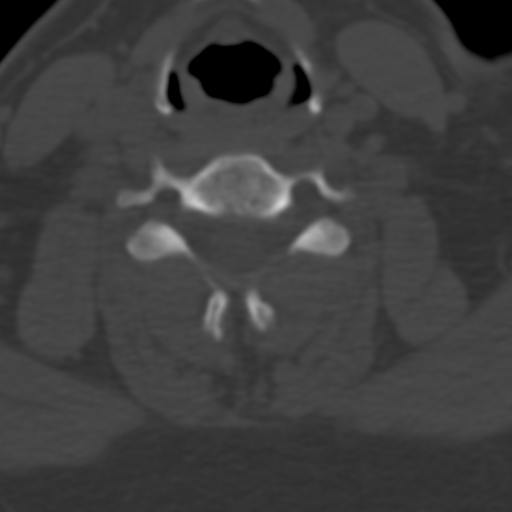
[im 53/79  bone]
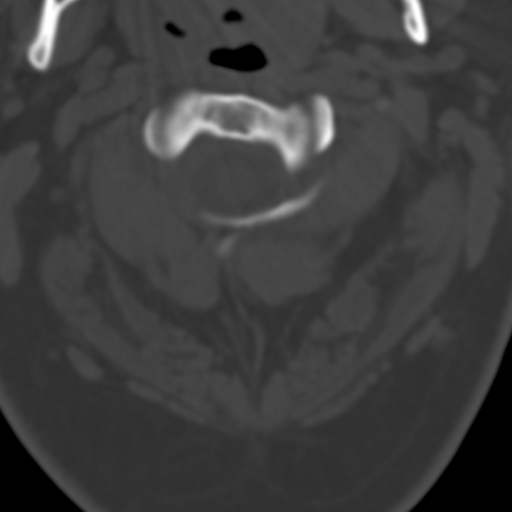

[Series 20: axial · axial · 0.21mm/px · z∈[-61,-10]mm · 2 of 82 slices shown]
[im 28/82  bone]
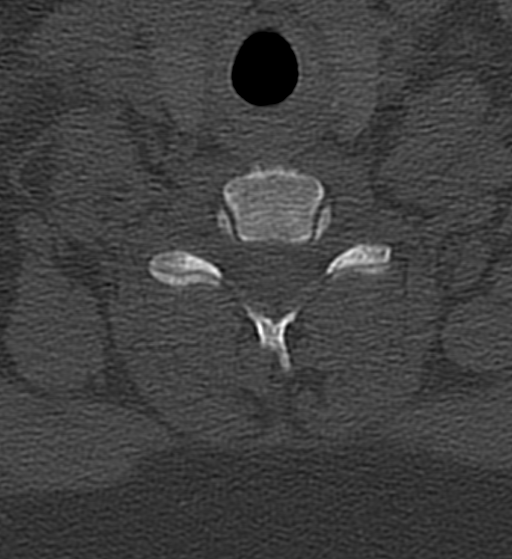
[im 55/82  bone]
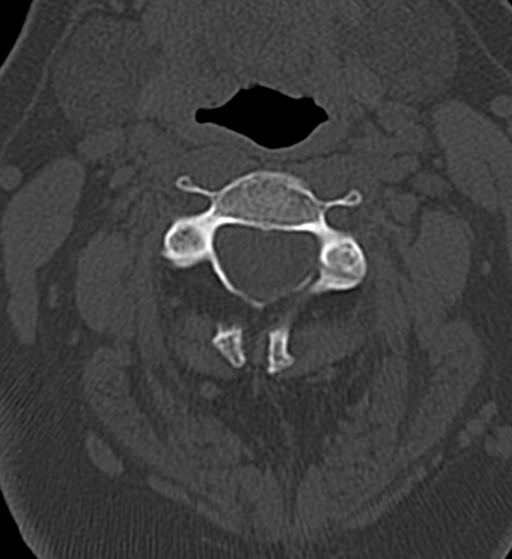

[10 of 34 positions shown; findings below may reference images not displayed]

FINDINGS: CT HEAD FINDINGS

Brain: No evidence of acute infarction, hemorrhage, hydrocephalus,
extra-axial collection or mass lesion/mass effect.

Vascular: No hyperdense vessel or unexpected calcification.

Skull: Normal. Negative for fracture or focal lesion.

Other: None.

CT MAXILLOFACIAL FINDINGS

Osseous: No fracture or mandibular dislocation. No destructive
process.

Orbits: Negative. No traumatic or inflammatory finding.

Sinuses: Clear.

Soft tissues: Negative.

CT CERVICAL SPINE FINDINGS

Alignment: Normal.

Skull base and vertebrae: No acute fracture. No primary bone lesion
or focal pathologic process.

Soft tissues and spinal canal: No prevertebral fluid or swelling. No
visible canal hematoma.

Disc levels:  Minimal degenerative changes.

Upper chest: Negative.

Other: None.
IMPRESSION: 1.  No acute intracranial abnormality.
2. No acute facial fracture.
3.  No acute cervical spine fracture.

## 2020-11-17 ENCOUNTER — Other Ambulatory Visit: Payer: Self-pay

## 2020-11-17 ENCOUNTER — Other Ambulatory Visit (HOSPITAL_COMMUNITY): Payer: Self-pay

## 2020-11-17 ENCOUNTER — Other Ambulatory Visit (HOSPITAL_BASED_OUTPATIENT_CLINIC_OR_DEPARTMENT_OTHER): Payer: Self-pay

## 2020-11-17 ENCOUNTER — Emergency Department (HOSPITAL_BASED_OUTPATIENT_CLINIC_OR_DEPARTMENT_OTHER)
Admission: EM | Admit: 2020-11-17 | Discharge: 2020-11-17 | Disposition: A | Discharge status: Home / Self Care | Payer: Medicare Other | Attending: Emergency Medicine | Admitting: Emergency Medicine

## 2020-11-17 DIAGNOSIS — Z760 Encounter for issue of repeat prescription: Secondary | ICD-10-CM | POA: Diagnosis not present

## 2020-11-17 MED ORDER — LACOSAMIDE 200 MG PO TABS
200.0000 mg | ORAL_TABLET | Freq: Two times a day (BID) | ORAL | 0 refills | Status: DC
  Filled 2020-11-17: qty 60, 30d supply, fill #0

## 2020-11-17 NOTE — ED Notes (Signed)
Dc instructions and prescription reviewed with patient and her caregiver. Stated understanding.

## 2020-11-17 NOTE — Discharge Instructions (Addendum)
Please contact the neurology office this morning to request a close follow-up appointment to discuss management of her seizures.  Return to ER as needed.

## 2020-11-17 NOTE — ED Provider Notes (Signed)
[facility] EMERGENCY DEPT Provider Note   CSN: [id number] Arrival date & time: 11/17/20  0865     History Chief Complaint  Patient presents with   Medication Refill    Kristi King is a 40 y.o. female.  Presents to ER for refill on her Vimpat.  Has seizure history, managed on multiple antiepileptics.  No recent seizures.  Had called the neurology office a couple days ago for medication refill however when they went to pick up the medications, the pharmacy was missing the Vimpat.  Patient otherwise feels fine today and has no medical complaints.  HPI     Past Medical History:  Diagnosis Date   Moderate intellectual disability    Mood disorder (HCC)    Seizures (HCC)     Patient Active Problem List   Diagnosis Date Noted   Seizure (HCC) 09/19/2016    No past surgical history on file.   OB History   No obstetric history on file.     Family History  Problem Relation Age of Onset   Epilepsy Mother     Social History   Tobacco Use   Smoking status: Never   Smokeless tobacco: Never  Substance Use Topics   Alcohol use: No   Drug use: No    Home Medications Prior to Admission medications   Medication Sig Start Date End Date Taking? Authorizing Provider  gemfibrozil (LOPID) 600 MG tablet Take 600 mg by mouth at bedtime. 09/11/16  Yes [provider]  lacosamide (VIMPAT) 200 MG TABS tablet Take 1 tablet (200 mg total) by mouth 2 (two) times daily. 11/17/20  Yes Milagros Loll, MD  lacosamide (VIMPAT) 200 MG TABS tablet Take 1 tablet (200 mg total) by mouth 2 (two) times daily. 11/17/20  Yes Milagros Loll, MD  levETIRAcetam (KEPPRA) 750 MG tablet Take 750 mg by mouth 2 (two) times daily. 09/19/16  Yes [provider]  VIMPAT 150 MG TABS Take 150 mg by mouth 2 (two) times daily. 08/21/16  Yes [provider]  acetaminophen (TYLENOL) 500 MG tablet Take 1 tablet (500 mg total) by mouth every 6 (six) hours as needed.  11/04/15   Fayrene Helper, PA-C  ARIPiprazole (ABILIFY) 10 MG tablet Take 10 mg by mouth daily. 09/18/16   [provider]  cetirizine (ZYRTEC) 10 MG tablet Take 10 mg by mouth daily. 09/11/16   [provider]  esomeprazole (NEXIUM) 40 MG capsule Take 40 mg by mouth at bedtime. 08/28/16   [provider]  fluticasone (FLONASE) 50 MCG/ACT nasal spray Place 1 spray into the nose daily as needed.    [provider]  furosemide (LASIX) 20 MG tablet Take 20 mg by mouth daily. 09/18/16   [provider]  hydrOXYzine (VISTARIL) 25 MG capsule Take 25-75 mg by mouth 2 (two) times daily. 1 qam and 1 qhs 08/19/16   [provider]  Melatonin 10 MG TABS Take 10 mg by mouth at bedtime.    [provider]  Omega-3 Fatty Acids (FISH OIL OMEGA-3) 1000 MG CAPS Take 1,000 mg by mouth 2 (two) times daily.    [provider]  tiZANidine (ZANAFLEX) 4 MG tablet Take 4 mg by mouth 2 (two) times daily. 09/19/16   [provider]  Vitamin D, Ergocalciferol, (DRISDOL) 50000 units CAPS capsule Take 1 capsule by mouth once a week. 09/07/16   [provider]    Allergies    Dilantin [phenytoin] and Tegretol [carbamazepine]  Review  of Systems   Review of Systems  All other systems reviewed and are negative.  Physical Exam Updated Vital Signs BP (!) 131/57   Pulse 65   Temp 97.9 F (36.6 C) (Oral)   Resp 20   Physical Exam Vitals and nursing note reviewed.  Constitutional:      General: She is not in acute distress.    Appearance: She is well-developed.  HENT:     Head: Normocephalic and atraumatic.  Eyes:     Conjunctiva/sclera: Conjunctivae normal.  Cardiovascular:     Rate and Rhythm: Normal rate.     Pulses: Normal pulses.  Pulmonary:     Effort: Pulmonary effort is normal. No respiratory distress.  Abdominal:     Palpations: Abdomen is soft.     Tenderness: There is no abdominal tenderness.  Musculoskeletal:         General: No deformity or signs of injury.     Cervical back: Neck supple.  Skin:    General: Skin is warm and dry.     Capillary Refill: Capillary refill takes less than 2 seconds.  Neurological:     General: No focal deficit present.     Mental Status: She is alert.  Psychiatric:        Mood and Affect: Mood normal.        Thought Content: Thought content normal.    ED Results / Procedures / Treatments   Labs (all labs ordered are listed, but only abnormal results are displayed) Labs Reviewed - No data to display  EKG None  Radiology No results found.  Procedures Procedures   Medications Ordered in ED Medications - No data to display  ED Course  I have reviewed the triage vital signs and the nursing notes.  Pertinent labs & imaging results that were available during my care of the patient were reviewed by me and considered in my medical decision making (see chart for details).  Clinical Course as of 11/17/20 1558  Thu Nov 17, 2020  0803 lacosamide (VIMPAT) 200 mg Tab tablet levETIRAcetam (KEPPRA) 750 MG tablet zonisamide (ZONEGRAN) 100 MG capsule   [RD]    Clinical Course User Index [RD] Milagros Loll, MD   MDM Rules/Calculators/A&P                           40 year old lady presents to ER with concern for medication refill.  No other medical complaints.  Reviewed chart, care everywhere, verified that patient was in fact supposed to be on Vimpat.  Provided single 30-day refill.  Instructed patient to follow-up with her neurologist to discuss medication management in the future.  After the discussed management above, the patient was determined to be safe for discharge.  The patient was in agreement with this plan and all questions regarding their care were answered.  ED return precautions were discussed and the patient will return to the ED with any significant worsening of condition.  Final Clinical Impression(s) / ED Diagnoses Final diagnoses:  Medication  refill    Rx / DC Orders ED Discharge Orders          Ordered    lacosamide (VIMPAT) 200 MG TABS tablet  2 times daily        11/17/20 0825    lacosamide (VIMPAT) 200 MG TABS tablet  2 times daily        11/17/20 0913  Milagros Loll, MD 11/18/20 [phone]

## 2020-11-17 NOTE — ED Triage Notes (Signed)
Pt ran out of seizure meds last night. Vimpat 200 mg two times day

## 2021-02-13 ENCOUNTER — Encounter (HOSPITAL_BASED_OUTPATIENT_CLINIC_OR_DEPARTMENT_OTHER): Payer: Self-pay | Admitting: Urology

## 2021-02-13 ENCOUNTER — Emergency Department (HOSPITAL_BASED_OUTPATIENT_CLINIC_OR_DEPARTMENT_OTHER)
Admission: EM | Admit: 2021-02-13 | Discharge: 2021-02-13 | Disposition: A | Discharge status: Home / Self Care | Payer: Medicare Other | Attending: Emergency Medicine | Admitting: Emergency Medicine

## 2021-02-13 ENCOUNTER — Other Ambulatory Visit: Payer: Self-pay

## 2021-02-13 DIAGNOSIS — M545 Low back pain, unspecified: Secondary | ICD-10-CM | POA: Insufficient documentation

## 2021-02-13 DIAGNOSIS — G40909 Epilepsy, unspecified, not intractable, without status epilepticus: Secondary | ICD-10-CM | POA: Diagnosis present

## 2021-02-13 DIAGNOSIS — R569 Unspecified convulsions: Secondary | ICD-10-CM

## 2021-02-13 LAB — CBC WITH DIFFERENTIAL/PLATELET
Abs Immature Granulocytes: 0.02 10*3/uL (ref 0.00–0.07)
Basophils Absolute: 0.1 10*3/uL (ref 0.0–0.1)
Basophils Relative: 1 %
Eosinophils Absolute: 0.1 10*3/uL (ref 0.0–0.5)
Eosinophils Relative: 1 %
HCT: 41.9 % (ref 36.0–46.0)
Hemoglobin: 14.1 g/dL (ref 12.0–15.0)
Immature Granulocytes: 0 %
Lymphocytes Relative: 24 %
Lymphs Abs: 2.2 10*3/uL (ref 0.7–4.0)
MCH: 29.3 pg (ref 26.0–34.0)
MCHC: 33.7 g/dL (ref 30.0–36.0)
MCV: 86.9 fL (ref 80.0–100.0)
Monocytes Absolute: 0.7 10*3/uL (ref 0.1–1.0)
Monocytes Relative: 8 %
Neutro Abs: 6 10*3/uL (ref 1.7–7.7)
Neutrophils Relative %: 66 %
Platelets: 269 10*3/uL (ref 150–400)
RBC: 4.82 MIL/uL (ref 3.87–5.11)
RDW: 13.3 % (ref 11.5–15.5)
WBC: 9.2 10*3/uL (ref 4.0–10.5)
nRBC: 0 % (ref 0.0–0.2)

## 2021-02-13 LAB — BASIC METABOLIC PANEL
Anion gap: 9 (ref 5–15)
BUN: 12 mg/dL (ref 6–20)
CO2: 25 mmol/L (ref 22–32)
Calcium: 9.4 mg/dL (ref 8.9–10.3)
Chloride: 104 mmol/L (ref 98–111)
Creatinine, Ser: 0.65 mg/dL (ref 0.44–1.00)
GFR, Estimated: >60 mL/min (ref >60)
Glucose, Bld: 101 mg/dL — ABNORMAL HIGH (ref 70–99)
Potassium: 3.5 mmol/L (ref 3.5–5.1)
Sodium: 138 mmol/L (ref 135–145)

## 2021-02-13 LAB — URINALYSIS, ROUTINE W REFLEX MICROSCOPIC
Bilirubin Urine: NEGATIVE
Glucose, UA: NEGATIVE mg/dL
Hgb urine dipstick: NEGATIVE
Ketones, ur: NEGATIVE mg/dL
Leukocytes,Ua: NEGATIVE
Nitrite: NEGATIVE
Protein, ur: NEGATIVE mg/dL
Specific Gravity, Urine: 1.01 (ref 1.005–1.030)
pH: 5.5 (ref 5.0–8.0)

## 2021-02-13 NOTE — Discharge Instructions (Signed)
You were seen in the emergency department for an episode of seizure.  You had lab work done that did not show any significant abnormalities.  Please continue your regular medications and follow-up with your neurologist.  Return to the emergency department if any worsening or concerning symptoms

## 2021-02-13 NOTE — ED Provider Notes (Signed)
[facility] EMERGENCY DEPT Provider Note   CSN: [id number] Arrival date & time: 02/13/21  1522     History  Chief Complaint  Patient presents with   Seizures    Kristi King is a 41 y.o. female.  She has a history of seizures disorder.  Last seizure was about 5 months ago.  She is taking Keppra and Vimpat.  Today she had a seizure that involved body rigidity and eyes rolling back that lasted about 6 minutes per her caregiver.  She is back to baseline now.  Complaining of some low back pain.  No recent illness.  Has been compliant with her medications.  Has a neurologist through [place].  The history is provided by the patient and a caregiver.  Seizures Seizure activity on arrival: no   Seizure type:  Grand mal Initial focality:  None Episode characteristics: stiffening   Return to baseline: yes   Duration:  6 minutes Progression:  Resolved Recent head injury:  No recent head injuries PTA treatment:  None History of seizures: yes       Home Medications Prior to Admission medications   Medication Sig Start Date End Date Taking? Authorizing Provider  acetaminophen (TYLENOL) 500 MG tablet Take 1 tablet (500 mg total) by mouth every 6 (six) hours as needed. 11/04/15   Fayrene Helper, PA-C  ARIPiprazole (ABILIFY) 10 MG tablet Take 10 mg by mouth daily. 09/18/16   [provider]  cetirizine (ZYRTEC) 10 MG tablet Take 10 mg by mouth daily. 09/11/16   [provider]  esomeprazole (NEXIUM) 40 MG capsule Take 40 mg by mouth at bedtime. 08/28/16   [provider]  fluticasone (FLONASE) 50 MCG/ACT nasal spray Place 1 spray into the nose daily as needed.    [provider]  furosemide (LASIX) 20 MG tablet Take 20 mg by mouth daily. 09/18/16   [provider]  gemfibrozil (LOPID) 600 MG tablet Take 600 mg by mouth at bedtime. 09/11/16   [provider]  hydrOXYzine (VISTARIL) 25 MG capsule Take 25-75 mg by mouth 2 (two) times  daily. 1 qam and 1 qhs 08/19/16   [provider]  lacosamide (VIMPAT) 200 MG TABS tablet Take 1 tablet (200 mg total) by mouth 2 (two) times daily. 11/17/20   Milagros Loll, MD  lacosamide (VIMPAT) 200 MG TABS tablet Take 1 tablet (200 mg total) by mouth 2 (two) times daily. 11/17/20   Milagros Loll, MD  levETIRAcetam (KEPPRA) 750 MG tablet Take 750 mg by mouth 2 (two) times daily. 09/19/16   [provider]  Melatonin 10 MG TABS Take 10 mg by mouth at bedtime.    [provider]  Omega-3 Fatty Acids (FISH OIL OMEGA-3) 1000 MG CAPS Take 1,000 mg by mouth 2 (two) times daily.    [provider]  tiZANidine (ZANAFLEX) 4 MG tablet Take 4 mg by mouth 2 (two) times daily. 09/19/16   [provider]  VIMPAT 150 MG TABS Take 150 mg by mouth 2 (two) times daily. 08/21/16   [provider]  Vitamin D, Ergocalciferol, (DRISDOL) 50000 units CAPS capsule Take 1 capsule by mouth once a week. 09/07/16   [provider]      Allergies    Dilantin [phenytoin] and Tegretol [carbamazepine]    Review of Systems   Review of Systems  Constitutional:  Negative for fever.  HENT:  Negative for sore throat.   Eyes:  Negative for visual disturbance.  Respiratory:  Negative for shortness of breath.   Cardiovascular:  Negative for chest pain.  Gastrointestinal:  Negative for abdominal pain.  Genitourinary:  Negative for dysuria.  Musculoskeletal:  Positive for back pain.  Skin:  Negative for rash.  Neurological:  Positive for seizures.   Physical Exam Updated Vital Signs BP 118/79    Pulse 76    Temp 97.8 F (36.6 C)    Resp 18    Ht 5\' 8"  (1.727 m)    Wt 98.9 kg    LMP 02/06/2021 (Exact Date)    SpO2 100%    BMI 33.15 kg/m  Physical Exam Vitals and nursing note reviewed.  Constitutional:      General: She is not in acute distress.    Appearance: Normal appearance. She is well-developed.  HENT:     Head: Normocephalic and atraumatic.   Eyes:     Conjunctiva/sclera: Conjunctivae normal.  Cardiovascular:     Rate and Rhythm: Normal rate and regular rhythm.     Heart sounds: No murmur heard. Pulmonary:     Effort: Pulmonary effort is normal. No respiratory distress.     Breath sounds: Normal breath sounds.  Abdominal:     Palpations: Abdomen is soft.     Tenderness: There is no abdominal tenderness. There is no guarding or rebound.  Musculoskeletal:        General: No swelling. Normal range of motion.     Cervical back: Neck supple.  Skin:    General: Skin is warm and dry.     Capillary Refill: Capillary refill takes less than 2 seconds.  Neurological:     General: No focal deficit present.     Mental Status: She is alert.     Sensory: No sensory deficit.     Motor: No weakness.  Psychiatric:        Mood and Affect: Mood normal.    ED Results / Procedures / Treatments   Labs (all labs ordered are listed, but only abnormal results are displayed) Labs Reviewed  BASIC METABOLIC PANEL - Abnormal; Notable for the following components:      Result Value   Glucose, Bld 101 (*)    All other components within normal limits  CBC WITH DIFFERENTIAL/PLATELET  URINALYSIS, ROUTINE W REFLEX MICROSCOPIC    EKG None  Radiology No results found.  Procedures Procedures    Medications Ordered in ED Medications - No data to display  ED Course/ Medical Decision Making/ A&P                           Medical Decision Making Amount and/or Complexity of Data Reviewed Labs: ordered.  This patient complains of seizure; this involves an extensive number of treatment Options and is a complaint that carries with it a high risk of complications and Morbidity. The differential includes breakthrough seizure, infection, metabolic derangement  I ordered, reviewed and interpreted labs, which included CBC with normal white count normal hemoglobin, chemistries normal, urinalysis without signs of infection Additional history  obtained from patient's caregiver Previous records obtained and reviewed in epic including prior ED visits  After the interventions stated above, I reevaluated the patient and found patient at baseline mental status.  Vitals stable.  No indications for admission at this time.  Recommended continuing current medications and following up with outpatient neurology.  Return instructions discussed.          Final Clinical Impression(s) / ED Diagnoses Final diagnoses:  Seizure [facility])    Rx / DC Orders ED Discharge Orders     None         Terrilee Files, MD 02/14/21 [phone]

## 2021-02-13 NOTE — ED Triage Notes (Signed)
Seizure today approx 15 min ago H/o seizures  Last one in august 2022.  On medication for seizures  A&O x 4

## 2021-03-22 ENCOUNTER — Encounter (HOSPITAL_BASED_OUTPATIENT_CLINIC_OR_DEPARTMENT_OTHER): Payer: Medicare Other | Admitting: Medical

## 2021-07-02 DIAGNOSIS — Z6841 Body Mass Index (BMI) 40.0 and over, adult: Secondary | ICD-10-CM

## 2021-07-18 ENCOUNTER — Other Ambulatory Visit: Payer: Self-pay

## 2021-07-18 ENCOUNTER — Emergency Department (HOSPITAL_BASED_OUTPATIENT_CLINIC_OR_DEPARTMENT_OTHER)
Admission: EM | Admit: 2021-07-18 | Discharge: 2021-07-18 | Disposition: A | Discharge status: Home / Self Care | Payer: Medicare Other | Attending: Emergency Medicine | Admitting: Emergency Medicine

## 2021-07-18 ENCOUNTER — Encounter (HOSPITAL_BASED_OUTPATIENT_CLINIC_OR_DEPARTMENT_OTHER): Payer: Self-pay

## 2021-07-18 ENCOUNTER — Emergency Department (HOSPITAL_BASED_OUTPATIENT_CLINIC_OR_DEPARTMENT_OTHER): Payer: Medicare Other

## 2021-07-18 ENCOUNTER — Emergency Department (HOSPITAL_BASED_OUTPATIENT_CLINIC_OR_DEPARTMENT_OTHER): Payer: Medicare Other | Admitting: Radiology

## 2021-07-18 DIAGNOSIS — R072 Precordial pain: Secondary | ICD-10-CM | POA: Insufficient documentation

## 2021-07-18 DIAGNOSIS — R0602 Shortness of breath: Secondary | ICD-10-CM | POA: Diagnosis not present

## 2021-07-18 DIAGNOSIS — R079 Chest pain, unspecified: Secondary | ICD-10-CM | POA: Diagnosis present

## 2021-07-18 DIAGNOSIS — R531 Weakness: Secondary | ICD-10-CM | POA: Insufficient documentation

## 2021-07-18 DIAGNOSIS — R1011 Right upper quadrant pain: Secondary | ICD-10-CM | POA: Diagnosis not present

## 2021-07-18 DIAGNOSIS — Z20822 Contact with and (suspected) exposure to covid-19: Secondary | ICD-10-CM | POA: Insufficient documentation

## 2021-07-18 DIAGNOSIS — M546 Pain in thoracic spine: Secondary | ICD-10-CM | POA: Insufficient documentation

## 2021-07-18 DIAGNOSIS — Z79899 Other long term (current) drug therapy: Secondary | ICD-10-CM | POA: Diagnosis not present

## 2021-07-18 DIAGNOSIS — R059 Cough, unspecified: Secondary | ICD-10-CM | POA: Diagnosis not present

## 2021-07-18 DIAGNOSIS — R112 Nausea with vomiting, unspecified: Secondary | ICD-10-CM | POA: Insufficient documentation

## 2021-07-18 LAB — BASIC METABOLIC PANEL
Anion gap: 12 (ref 5–15)
BUN: 10 mg/dL (ref 6–20)
CO2: 17 mmol/L — ABNORMAL LOW (ref 22–32)
Calcium: 9.5 mg/dL (ref 8.9–10.3)
Chloride: 110 mmol/L (ref 98–111)
Creatinine, Ser: 0.62 mg/dL (ref 0.44–1.00)
GFR, Estimated: >60 mL/min (ref >60)
Glucose, Bld: 105 mg/dL — ABNORMAL HIGH (ref 70–99)
Potassium: 3.4 mmol/L — ABNORMAL LOW (ref 3.5–5.1)
Sodium: 139 mmol/L (ref 135–145)

## 2021-07-18 LAB — CBC
HCT: 40.2 % (ref 36.0–46.0)
Hemoglobin: 13.8 g/dL (ref 12.0–15.0)
MCH: 29.3 pg (ref 26.0–34.0)
MCHC: 34.3 g/dL (ref 30.0–36.0)
MCV: 85.4 fL (ref 80.0–100.0)
Platelets: 307 10*3/uL (ref 150–400)
RBC: 4.71 MIL/uL (ref 3.87–5.11)
RDW: 13.2 % (ref 11.5–15.5)
WBC: 9.7 10*3/uL (ref 4.0–10.5)
nRBC: 0 % (ref 0.0–0.2)

## 2021-07-18 LAB — HEPATIC FUNCTION PANEL
ALT: 10 U/L (ref 0–44)
AST: 11 U/L — ABNORMAL LOW (ref 15–41)
Albumin: 4.3 g/dL (ref 3.5–5.0)
Alkaline Phosphatase: 52 U/L (ref 38–126)
Bilirubin, Direct: 0.1 mg/dL (ref 0.0–0.2)
Indirect Bilirubin: 0.3 mg/dL (ref 0.3–0.9)
Total Bilirubin: 0.4 mg/dL (ref 0.3–1.2)
Total Protein: 6.9 g/dL (ref 6.5–8.1)

## 2021-07-18 LAB — TROPONIN I (HIGH SENSITIVITY): Troponin I (High Sensitivity): <2 ng/L (ref <18)

## 2021-07-18 LAB — SARS CORONAVIRUS 2 BY RT PCR: SARS Coronavirus 2 by RT PCR: NEGATIVE

## 2021-07-18 LAB — PREGNANCY, URINE: Preg Test, Ur: NEGATIVE

## 2021-07-18 LAB — LIPASE, BLOOD: Lipase: 28 U/L (ref 11–51)

## 2021-07-18 MED ORDER — SUCRALFATE 1 G PO TABS
1.0000 g | ORAL_TABLET | Freq: Three times a day (TID) | ORAL | 0 refills | Status: DC
  Filled 2021-07-18: qty 56, 14d supply, fill #0

## 2021-07-18 MED ORDER — ALUM & MAG HYDROXIDE-SIMETH 200-200-20 MG/5ML PO SUSP
30.0000 mL | Freq: Once | ORAL | Status: AC
Start: 2021-07-18 — End: 2021-07-18
  Administered 2021-07-18: 30 mL via ORAL
  Filled 2021-07-18: qty 30

## 2021-07-18 MED ORDER — BENZONATATE 100 MG PO CAPS
100.0000 mg | ORAL_CAPSULE | Freq: Three times a day (TID) | ORAL | 0 refills | Status: DC
  Filled 2021-07-18: qty 21, 7d supply, fill #0

## 2021-07-18 NOTE — ED Triage Notes (Signed)
Pt c/o intermittent chest pain, nausea, and sob that started this weekend. Caregiver reports patient had a seizure on Sunday. Hx of.

## 2021-07-18 NOTE — ED Provider Notes (Cosign Needed Addendum)
[facility] EMERGENCY DEPT Provider Note   CSN: [id number] Arrival date & time: 07/18/21  1517     History  Chief Complaint  Patient presents with   Chest Pain   Back Pain    Kristi King is a 41 y.o. female With a history of complex partial seizure evolving to generalized seizure (on Keppra 750 mg twice daily), migraine, depression, bipolar 1 disorder, generalized anxiety disorder.  Presents to the emergency department complaint of chest pain, back pain, and a seizure.  Patient reports that she started having chest pain on 07/08/2021 after eating a PB&J sandwich that she believes had mold on it.  Patient reports that pain is located along her entire sternum.  Pain has been constant since then.  Pain is worse when "coughing and huffing and puffing."  Patient does endorse shortness of breath and 1 episode of nausea and vomiting.  Patient reports that her vomiting episode occurred on Saturday.  Patient describes emesis as "green and white."  Patient reports that she started having thoracic back pain on 07/08/2021 as well.  Pain is located to her thoracic back and does not radiate.  Pain has remained constant.  Patient denies any recent falls or heavy lifting.  Patient reports that she had a seizure on Sunday.  Patient states that she was feeling weak when she knocked on the door for AFL.  Patient states the next thing she knew she was back in her bed.  Patient reports compliance with her Keppra medication.  Denies any fever, chills, hematemesis, coffee-ground emesis, abdominal pain, constipation, diarrhea, blood in stool, melena, dysuria, hematuria, urinary urgency, vaginal discharge, vaginal pain, leg swelling or tenderness, palpitations, saddle anesthesia, numbness, weakness, bowel/bladder dysfunction.  Patient reports that she is currently on her menstrual period.  Denies any tobacco use.   Chest Pain Associated symptoms: back pain, cough and shortness of breath    Associated symptoms: no abdominal pain, no dizziness, no fever, no headache, no nausea, no palpitations and no vomiting   Back Pain Associated symptoms: chest pain   Associated symptoms: no abdominal pain, no dysuria, no fever and no headaches        Home Medications Prior to Admission medications   Medication Sig Start Date End Date Taking? Authorizing Provider  acetaminophen (TYLENOL) 500 MG tablet Take 1 tablet (500 mg total) by mouth every 6 (six) hours as needed. 11/04/15   Fayrene Helper, PA-C  ARIPiprazole (ABILIFY) 10 MG tablet Take 10 mg by mouth daily. 09/18/16   [provider]  cetirizine (ZYRTEC) 10 MG tablet Take 10 mg by mouth daily. 09/11/16   [provider]  esomeprazole (NEXIUM) 40 MG capsule Take 40 mg by mouth at bedtime. 08/28/16   [provider]  fluticasone (FLONASE) 50 MCG/ACT nasal spray Place 1 spray into the nose daily as needed.    [provider]  furosemide (LASIX) 20 MG tablet Take 20 mg by mouth daily. 09/18/16   [provider]  gemfibrozil (LOPID) 600 MG tablet Take 600 mg by mouth at bedtime. 09/11/16   [provider]  hydrOXYzine (VISTARIL) 25 MG capsule Take 25-75 mg by mouth 2 (two) times daily. 1 qam and 1 qhs 08/19/16   [provider]  lacosamide (VIMPAT) 200 MG TABS tablet Take 1 tablet (200 mg total) by mouth 2 (two) times daily. 11/17/20   Milagros Loll, MD  lacosamide (VIMPAT) 200 MG TABS tablet Take 1 tablet (200 mg total) by mouth 2 (two) times  daily. 11/17/20   Milagros Loll, MD  levETIRAcetam (KEPPRA) 750 MG tablet Take 750 mg by mouth 2 (two) times daily. 09/19/16   [provider]  Melatonin 10 MG TABS Take 10 mg by mouth at bedtime.    [provider]  Omega-3 Fatty Acids (FISH OIL OMEGA-3) 1000 MG CAPS Take 1,000 mg by mouth 2 (two) times daily.    [provider]  tiZANidine (ZANAFLEX) 4 MG tablet Take 4 mg by mouth 2 (two) times daily. 09/19/16    [provider]  VIMPAT 150 MG TABS Take 150 mg by mouth 2 (two) times daily. 08/21/16   [provider]  Vitamin D, Ergocalciferol, (DRISDOL) 50000 units CAPS capsule Take 1 capsule by mouth once a week. 09/07/16   [provider]      Allergies    Dilantin [phenytoin] and Tegretol [carbamazepine]    Review of Systems   Review of Systems  Constitutional:  Negative for chills and fever.  Eyes:  Negative for visual disturbance.  Respiratory:  Positive for cough and shortness of breath.   Cardiovascular:  Positive for chest pain. Negative for palpitations and leg swelling.  Gastrointestinal:  Negative for abdominal pain, nausea and vomiting.  Genitourinary:  Negative for difficulty urinating, dysuria, flank pain, frequency, genital sores, hematuria, urgency, vaginal bleeding, vaginal discharge and vaginal pain.  Musculoskeletal:  Positive for back pain. Negative for neck pain.  Skin:  Negative for color change and rash.  Neurological:  Negative for dizziness, syncope, light-headedness and headaches.  Psychiatric/Behavioral:  Negative for confusion.     Physical Exam Updated Vital Signs BP (!) 138/100 (BP Location: Left Arm)   Pulse 80   Temp 98.8 F (37.1 C)   Resp 18   SpO2 98%  Physical Exam Vitals and nursing note reviewed.  Constitutional:      General: She is not in acute distress.    Appearance: She is not ill-appearing, toxic-appearing or diaphoretic.  HENT:     Head: Normocephalic.  Eyes:     General: No scleral icterus.       Right eye: No discharge.        Left eye: No discharge.  Cardiovascular:     Rate and Rhythm: Normal rate.     Pulses:          Radial pulses are 2+ on the right side and 2+ on the left side.  Pulmonary:     Effort: Pulmonary effort is normal. No tachypnea, bradypnea or respiratory distress.     Breath sounds: Normal breath sounds. No stridor.     Comments: Speaks in full complete sentences without  difficulty. Chest:     Comments: Tenderness to palpation of mid sternum Abdominal:     General: Abdomen is flat. Bowel sounds are normal. There is no distension. There are no signs of injury.     Palpations: Abdomen is soft. There is no mass or pulsatile mass.     Tenderness: There is abdominal tenderness in the right upper quadrant. There is no right CVA tenderness, left CVA tenderness, guarding or rebound.  Musculoskeletal:     Right lower leg: No edema.     Left lower leg: No edema.  Skin:    General: Skin is warm and dry.  Neurological:     General: No focal deficit present.     Mental Status: She is alert.  Psychiatric:        Behavior: Behavior is cooperative.  ED Results / Procedures / Treatments   Labs (all labs ordered are listed, but only abnormal results are displayed) Labs Reviewed  BASIC METABOLIC PANEL - Abnormal; Notable for the following components:      Result Value   Potassium 3.4 (*)    CO2 17 (*)    Glucose, Bld 105 (*)    All other components within normal limits  HEPATIC FUNCTION PANEL - Abnormal; Notable for the following components:   AST 11 (*)    All other components within normal limits  SARS CORONAVIRUS 2 BY RT PCR  CBC  PREGNANCY, URINE  LIPASE, BLOOD  LEVETIRACETAM LEVEL  TROPONIN I (HIGH SENSITIVITY)    EKG None  Radiology US Abdomen Limited RUQ (LIVER/GB)  Result Date: 07/18/2021 CLINICAL DATA:  Chest pain and nausea. EXAM: ULTRASOUND ABDOMEN LIMITED RIGHT UPPER QUADRANT COMPARISON:  Right upper quadrant ultrasound 02/06/2019 FINDINGS: Gallbladder: No gallstones or wall thickening visualized. No sonographic Murphy sign noted by sonographer. Common bile duct: Diameter: 4 mm Liver: No focal lesion identified. Within normal limits in parenchymal echogenicity. Portal vein is patent on color Doppler imaging with normal direction of blood flow towards the liver. Other: None. IMPRESSION: Normal right upper quadrant ultrasound. Electronically  Signed   By: Darliss Cheney M.D.   On: 07/18/2021 17:34   DG Chest 2 View  Result Date: 07/18/2021 CLINICAL DATA:  Chest pain EXAM: CHEST - 2 VIEW COMPARISON:  02/06/2019 FINDINGS: The heart size and mediastinal contours are within normal limits. Both lungs are clear. The visualized skeletal structures are unremarkable. IMPRESSION: No active cardiopulmonary disease. Electronically Signed   By: Ernie Avena M.D.   On: 07/18/2021 15:58    Procedures Procedures    Medications Ordered in ED Medications - No data to display  ED Course/ Medical Decision Making/ A&P Clinical Course as of 07/18/21 1712  Tue Jul 18, 2021  1623 I spoke to [place] who is the AFL liter that Naidelyn lives with.  Reports that patient has a history of intellectual developmental delay.  She states that "Priscille tends to exaggerate things, left to get medications and blood work drawn."  Patient did have a seizure on Sunday.  Seizure lasted 2 to 3 minutes patient came out of it.  Patient was postictal however this resolved without incident.  Patient has been taking her Keppra medication as prescribed.  States that Kobi has been dealing with a cough and cold over the last week or so.  Has been taking cough medication over-the-counter.  Patient is scheduled to follow-up with her primary care doctor on the 13th. [PB]    Clinical Course User Index [PB] Haskel Schroeder, PA-C                           Medical Decision Making Amount and/or Complexity of Data Reviewed Labs: ordered. Radiology: ordered.  Risk OTC drugs. Prescription drug management.   Alert 41 year old female no acute distress, nontoxic-appearing.  Presents to the emergency department the chief complaint of chest pain and back pain.  Information obtained from patient and patient's AFL caregiver.  I reviewed patient's past medical record including previous divider notes, labs, and imaging.  Patient has medical history as outlined in HPI which  complicates her care.  With reports of chest pain differential diagnosis includes but is not limited to ACS, pneumothorax, pneumonia, acute pancreatitis, cholecystitis, PE.  ACS work-up initiated.  Will obtain abdominal labs as well as right upper  quadrant ultrasound due to tenderness on exam.  Low suspicion for PE as a can be ruled out via [facility] criteria.    I personally viewed and interpreted patient's EKG.  Tracing shows sinus rhythm.  I personally viewed and interpreted patient's x-ray imaging.  Agree with radiology interpretation of no active cardiopulmonary disease.  I personally viewed and interpreted patient's lab results.  Pertinent findings include: -Negative for COVID-19 -Urine pregnancy test negative -Troponin less than 2 -CBC, lipase, and hepatic function panel unremarkable -Bicarb 17  Ultrasound imaging of right upper quadrant shows no acute abnormality.  Patient has no seizures in the emergency department.  Suspect that patient's seizure was a breakthrough.  Keppra level checked and pending.  Patient follow-up with her neurologist for father management of her seizures  Patient reports improvement in pain after receiving GI cocktail.  Suspect that patient's pain may be musculoskeletal in nature secondary to coughing or GI related.  We will give patient prescription for Tessalon and proton pump inhibitor.  Discussed over-the-counter treatment.  Patient to follow-up with her PCP on the 13th of this month for repeat evaluation.  Prior to discharge I spoke to patient's caregiver [name] about patient care and treatment.  Based on patient's chief complaint, I considered admission might be necessary, however after reassuring ED workup feel patient is reasonable for discharge.  Discussed results, findings, treatment and follow up. Patient and patient's caregiver advised of return precautions. Patient and patient's caregiver verbalized understanding and agreed with plan.  Portions of  this note were generated with Scientist, clinical (histocompatibility and immunogenetics). Dictation errors may occur despite best attempts at proofreading.         Final Clinical Impression(s) / ED Diagnoses Final diagnoses:  Precordial chest pain    Rx / DC Orders ED Discharge Orders          Ordered    benzonatate (TESSALON) 100 MG capsule  Every 8 hours        07/18/21 1801    sucralfate (CARAFATE) 1 g tablet  3 times daily with meals & bedtime        07/18/21 1801              Haskel Schroeder, PA-C 07/18/21 1800    Haskel Schroeder, PA-C 07/18/21 1805    Ernie Avena, MD 07/19/21 [phone]

## 2021-07-18 NOTE — Discharge Instructions (Addendum)
You came to the emergency department today to be evaluated for your chest pain.  Your EKG and lab work several's were reassuring you are not having a heart attack today.  The ultrasound of your right upper quadrant did not show any problems with your gallbladder.  Your lab results were reassuring that you are pancreas, liver, and gallbladder are acting normally.  The exact cause of your chest pain is unknown at this time.  I have given you medication to help with your cough as well as a possible upset stomach.  Please follow-up with your primary care doctor for repeat evaluation.  Due to your seizure episode please follow-up with your neurologist for repeat evaluation.  Please continue to take your Keppra medication as prescribed.  Please take Ibuprofen (Advil, motrin) and Tylenol (acetaminophen) to relieve your pain.    You may take up to 600 MG (3 pills) of normal strength ibuprofen every 8 hours as needed.   You make take tylenol, up to 1,000 mg (two extra strength pills) every 8 hours as needed.   It is safe to take ibuprofen and tylenol at the same time as they work differently.   Do not take more than 3,000 mg tylenol in a 24 hour period (not more than one dose every 8 hours.  Please check all medication labels as many medications such as pain and cold medications may contain tylenol.  Do not drink alcohol while taking these medications.  Do not take other NSAID'S while taking ibuprofen (such as aleve or naproxen).  Please take ibuprofen with food to decrease stomach upset.  Get help right away if: Your chest pain gets worse. You have a cough that gets worse, or you cough up blood. You have severe pain in your abdomen. You faint. You have sudden, unexplained chest discomfort. You have sudden, unexplained discomfort in your arms, back, neck, or jaw. You have shortness of breath at any time. You suddenly start to sweat, or your skin gets clammy. You feel nausea or you vomit. You suddenly  feel lightheaded or dizzy. You have severe weakness, or unexplained weakness or fatigue. Your heart begins to beat quickly, or it feels like it is skipping beats.

## 2021-07-19 ENCOUNTER — Other Ambulatory Visit (HOSPITAL_BASED_OUTPATIENT_CLINIC_OR_DEPARTMENT_OTHER): Payer: Self-pay

## 2021-07-19 LAB — LEVETIRACETAM LEVEL: Levetiracetam Lvl: 35.6 ug/mL (ref 10.0–40.0)

## 2022-07-21 ENCOUNTER — Emergency Department (HOSPITAL_COMMUNITY): Payer: Medicare Other

## 2022-07-21 ENCOUNTER — Encounter (HOSPITAL_COMMUNITY): Payer: Self-pay

## 2022-07-21 ENCOUNTER — Other Ambulatory Visit: Payer: Self-pay

## 2022-07-21 ENCOUNTER — Inpatient Hospital Stay (HOSPITAL_COMMUNITY)
Admission: EM | Admit: 2022-07-21 | Discharge: 2022-07-31 | DRG: 101 | Disposition: A | Discharge status: Home Health | Payer: Medicare Other | Attending: Internal Medicine | Admitting: Internal Medicine

## 2022-07-21 DIAGNOSIS — G43909 Migraine, unspecified, not intractable, without status migrainosus: Secondary | ICD-10-CM | POA: Diagnosis present

## 2022-07-21 DIAGNOSIS — F418 Other specified anxiety disorders: Secondary | ICD-10-CM | POA: Insufficient documentation

## 2022-07-21 DIAGNOSIS — G9340 Encephalopathy, unspecified: Secondary | ICD-10-CM

## 2022-07-21 DIAGNOSIS — Z820 Family history of epilepsy and other diseases of the nervous system: Secondary | ICD-10-CM

## 2022-07-21 DIAGNOSIS — Z888 Allergy status to other drugs, medicaments and biological substances status: Secondary | ICD-10-CM

## 2022-07-21 DIAGNOSIS — F319 Bipolar disorder, unspecified: Secondary | ICD-10-CM

## 2022-07-21 DIAGNOSIS — Z79899 Other long term (current) drug therapy: Secondary | ICD-10-CM

## 2022-07-21 DIAGNOSIS — F71 Moderate intellectual disabilities: Secondary | ICD-10-CM | POA: Diagnosis present

## 2022-07-21 DIAGNOSIS — R4182 Altered mental status, unspecified: Secondary | ICD-10-CM

## 2022-07-21 DIAGNOSIS — F419 Anxiety disorder, unspecified: Secondary | ICD-10-CM | POA: Diagnosis present

## 2022-07-21 DIAGNOSIS — F79 Unspecified intellectual disabilities: Principal | ICD-10-CM

## 2022-07-21 DIAGNOSIS — Z6841 Body Mass Index (BMI) 40.0 and over, adult: Secondary | ICD-10-CM

## 2022-07-21 DIAGNOSIS — E872 Acidosis, unspecified: Secondary | ICD-10-CM

## 2022-07-21 DIAGNOSIS — F061 Catatonic disorder due to known physiological condition: Secondary | ICD-10-CM | POA: Diagnosis present

## 2022-07-21 DIAGNOSIS — Z765 Malingerer [conscious simulation]: Secondary | ICD-10-CM

## 2022-07-21 DIAGNOSIS — E876 Hypokalemia: Secondary | ICD-10-CM | POA: Insufficient documentation

## 2022-07-21 DIAGNOSIS — R569 Unspecified convulsions: Secondary | ICD-10-CM | POA: Diagnosis not present

## 2022-07-21 DIAGNOSIS — G40909 Epilepsy, unspecified, not intractable, without status epilepticus: Secondary | ICD-10-CM | POA: Diagnosis not present

## 2022-07-21 DIAGNOSIS — F919 Conduct disorder, unspecified: Secondary | ICD-10-CM | POA: Diagnosis present

## 2022-07-21 DIAGNOSIS — E7800 Pure hypercholesterolemia, unspecified: Secondary | ICD-10-CM

## 2022-07-21 DIAGNOSIS — I10 Essential (primary) hypertension: Secondary | ICD-10-CM

## 2022-07-21 LAB — COMPREHENSIVE METABOLIC PANEL
ALT: 10 U/L (ref 0–44)
AST: 15 U/L (ref 15–41)
Albumin: 3.5 g/dL (ref 3.5–5.0)
Alkaline Phosphatase: 48 U/L (ref 38–126)
Anion gap: 16 — ABNORMAL HIGH (ref 5–15)
BUN: 10 mg/dL (ref 6–20)
CO2: 17 mmol/L — ABNORMAL LOW (ref 22–32)
Calcium: 9 mg/dL (ref 8.9–10.3)
Chloride: 109 mmol/L (ref 98–111)
Creatinine, Ser: 0.65 mg/dL (ref 0.44–1.00)
GFR, Estimated: >60 mL/min (ref >60)
Glucose, Bld: 73 mg/dL (ref 70–99)
Potassium: 3.9 mmol/L (ref 3.5–5.1)
Sodium: 142 mmol/L (ref 135–145)
Total Bilirubin: 0.2 mg/dL — ABNORMAL LOW (ref 0.3–1.2)
Total Protein: 6.2 g/dL — ABNORMAL LOW (ref 6.5–8.1)

## 2022-07-21 LAB — CBC WITH DIFFERENTIAL/PLATELET
Abs Immature Granulocytes: 0.03 10*3/uL (ref 0.00–0.07)
Basophils Absolute: 0.1 10*3/uL (ref 0.0–0.1)
Basophils Relative: 0 %
Eosinophils Absolute: 0 10*3/uL (ref 0.0–0.5)
Eosinophils Relative: 0 %
HCT: 43.3 % (ref 36.0–46.0)
Hemoglobin: 13.9 g/dL (ref 12.0–15.0)
Immature Granulocytes: 0 %
Lymphocytes Relative: 20 %
Lymphs Abs: 2.2 10*3/uL (ref 0.7–4.0)
MCH: 29.6 pg (ref 26.0–34.0)
MCHC: 32.1 g/dL (ref 30.0–36.0)
MCV: 92.3 fL (ref 80.0–100.0)
Monocytes Absolute: 0.7 10*3/uL (ref 0.1–1.0)
Monocytes Relative: 6 %
Neutro Abs: 8.3 10*3/uL — ABNORMAL HIGH (ref 1.7–7.7)
Neutrophils Relative %: 74 %
Platelets: 254 10*3/uL (ref 150–400)
RBC: 4.69 MIL/uL (ref 3.87–5.11)
RDW: 14 % (ref 11.5–15.5)
WBC: 11.4 10*3/uL — ABNORMAL HIGH (ref 4.0–10.5)
nRBC: 0 % (ref 0.0–0.2)

## 2022-07-21 LAB — CBG MONITORING, ED: Glucose-Capillary: 74 mg/dL (ref 70–99)

## 2022-07-21 LAB — LACTIC ACID, PLASMA: Lactic Acid, Venous: 1.3 mmol/L (ref 0.5–1.9)

## 2022-07-21 LAB — CK: Total CK: 151 U/L (ref 38–234)

## 2022-07-21 MED ORDER — ORAL CARE MOUTH RINSE
15.0000 mL | OROMUCOSAL | Status: DC
  Administered 2022-07-22 (×5): 15 mL via OROMUCOSAL

## 2022-07-21 MED ORDER — SODIUM CHLORIDE 0.9 % IV SOLN
200.0000 mg | Freq: Two times a day (BID) | INTRAVENOUS | Status: DC
  Administered 2022-07-22 – 2022-07-23 (×3): 200 mg via INTRAVENOUS
  Filled 2022-07-21 (×4): qty 20

## 2022-07-21 MED ORDER — SODIUM CHLORIDE 0.9 % IV SOLN
750.0000 mg | Freq: Two times a day (BID) | INTRAVENOUS | Status: DC
  Administered 2022-07-22: 750 mg via INTRAVENOUS
  Filled 2022-07-21 (×3): qty 7.5

## 2022-07-21 MED ORDER — ENOXAPARIN SODIUM 40 MG/0.4ML IJ SOSY
40.0000 mg | PREFILLED_SYRINGE | Freq: Every day | INTRAMUSCULAR | Status: DC
  Administered 2022-07-22 – 2022-07-31 (×10): 40 mg via SUBCUTANEOUS
  Filled 2022-07-21 (×10): qty 0.4

## 2022-07-21 MED ORDER — SODIUM CHLORIDE 0.9 % IV SOLN: 75.0000 mL/h | INTRAVENOUS | Status: DC

## 2022-07-21 MED ORDER — SODIUM CHLORIDE 0.9 % IV SOLN
200.0000 mg | Freq: Once | INTRAVENOUS | Status: AC
  Administered 2022-07-21: 200 mg via INTRAVENOUS
  Filled 2022-07-21: qty 20

## 2022-07-21 MED ORDER — ORAL CARE MOUTH RINSE: 15.0000 mL | OROMUCOSAL | Status: DC | PRN

## 2022-07-21 MED ORDER — LEVETIRACETAM IN NACL 1500 MG/100ML IV SOLN
1500.0000 mg | Freq: Once | INTRAVENOUS | Status: AC
  Administered 2022-07-21: 1500 mg via INTRAVENOUS
  Filled 2022-07-21: qty 100

## 2022-07-21 NOTE — Assessment & Plan Note (Addendum)
Metabolic acidosis. Stable. I wonder if her seizure meds are doing this. oral bicarb 650 mg tid added. Serum bicarb improving.

## 2022-07-21 NOTE — Consult Note (Signed)
Neurology Consultation  Reason for Consult: Seizure, altered mental status Referring Physician: Dr. Dorna Mai  CC: Seizure, altered mental status  History is obtained from: Patient's chart, caregiver  HPI: Kristi King is a 42 y.o. female past medical history of moderate intellectual disability, mood disorder, seizures as well as concern for episodes being nonepileptic based on chart review from care everywhere from her neurologist outpatient-presented to the emergency department after being found altered.  She had a seizure yesterday that lasted about 3 minutes and she was postictal and sluggish and came to about 30 minutes later.  Today nobody witnessed the seizure but was presumed that she had a seizure because she laid unresponsive to voice and did not follow commands.  She has had trouble getting her medications and there was some prescription issues that prevented her from getting her antiepileptics for the past 2 days. No witnessed seizure activity in the ER. I was consulted over the phone-recommended IV lacosamide and levetiracetam, both of which were given but she continued to be very altered and off of her baseline which is usually talkative walking and being able to do her ADLs. I evaluated her and my recommendations are below. Patient has a documented history of headaches, seizures as well as a lot of seizure-like spells that have raise possibility of nonepileptic spells.  I did not see any EMU admissions verifying this.  ROS: Full ROS was performed and is negative except as noted in the HPI.  Past Medical History:  Diagnosis Date   Moderate intellectual disability    Mood disorder (HCC)    Seizures (HCC)     Family History  Problem Relation Age of Onset   Epilepsy Mother     Social History:   reports that she has never smoked. She has never used smokeless tobacco. She reports that she does not drink alcohol and does not use drugs.  Medications No current  facility-administered medications for this encounter.  Current Outpatient Medications:    benzonatate (TESSALON) 100 MG capsule, Take 1 capsule (100 mg total) by mouth every 8 (eight) hours., Disp: 21 capsule, Rfl: 0   cetirizine (ZYRTEC) 10 MG tablet, Take 10 mg by mouth daily., Disp: , Rfl: 0   fluticasone (FLONASE) 50 MCG/ACT nasal spray, Place 1 spray into the nose daily as needed., Disp: , Rfl:    sucralfate (CARAFATE) 1 g tablet, Take 1 tablet (1 g total) by mouth 4 (four) times daily -  with meals and at bedtime for 14 days., Disp: 56 tablet, Rfl: 0   Exam: Current vital signs: BP 100/60   Pulse 63   Temp 98.9 F (37.2 C) (Axillary)   Resp 18   Ht 5\' 2"  (1.575 m)   Wt 98.9 kg   SpO2 97%   BMI 39.88 kg/m  Vital signs in last 24 hours: Temp:  [98.2 F (36.8 C)-98.9 F (37.2 C)] 98.9 F (37.2 C) (07/13 1944) Pulse Rate:  [63-89] 63 (07/13 2245) Resp:  [16-41] 18 (07/13 1944) BP: (99-116)/(60-74) 100/60 (07/13 2245) SpO2:  [95 %-100 %] 97 % (07/13 2245) Weight:  [98.9 kg] 98.9 kg (07/13 1525) General: Drowsy, opens eyes off-and-on while I was not communicating with her but as soon as I started talking to her, kept her eyes closed forcibly. HEENT: Normocephalic atraumatic Lungs: Clear Cardiovascular: Regular rhythm Abdomen nondistended nontender Neurological exam Drowsy, Eyes open off-and-on while I was talking to the caretaker when I knocked on the door but as soon as I turn the  lights on and started interacting with her, kept her eyes forcibly closed.  Resisted eye opening.  Did not follow any commands Cranial nerves: Pupils equal round react to light, cannot assess extraocular movement due to her cooperation, did not blink to threat consistently from either side, face appears symmetric. Motor examination: Did not exhibit spontaneous movement but to noxious stimulation had strong withdrawal in all fours. Sensation: As above  Labs I have reviewed labs in epic and the  results pertinent to this consultation are:  CBC    Component Value Date/Time   WBC 11.4 (H) 07/21/2022 1707   RBC 4.69 07/21/2022 1707   HGB 13.9 07/21/2022 1707   HCT 43.3 07/21/2022 1707   PLT 254 07/21/2022 1707   MCV 92.3 07/21/2022 1707   MCH 29.6 07/21/2022 1707   MCHC 32.1 07/21/2022 1707   RDW 14.0 07/21/2022 1707   LYMPHSABS 2.2 07/21/2022 1707   MONOABS 0.7 07/21/2022 1707   EOSABS 0.0 07/21/2022 1707   BASOSABS 0.1 07/21/2022 1707    CMP     Component Value Date/Time   NA 142 07/21/2022 1707   K 3.9 07/21/2022 1707   CL 109 07/21/2022 1707   CO2 17 (L) 07/21/2022 1707   GLUCOSE 73 07/21/2022 1707   BUN 10 07/21/2022 1707   CREATININE 0.65 07/21/2022 1707   CALCIUM 9.0 07/21/2022 1707   PROT 6.2 (L) 07/21/2022 1707   ALBUMIN 3.5 07/21/2022 1707   AST 15 07/21/2022 1707   ALT 10 07/21/2022 1707   ALKPHOS 48 07/21/2022 1707   BILITOT 0.2 (L) 07/21/2022 1707   GFRNONAA >60 07/21/2022 1707   GFRAA >60 09/20/2016 0542   Imaging I have reviewed the images obtained:  CT-head no acute changes  Assessment:  42 year old past history of seizures, episodes concerning for nonepileptic spells, moderate intellectual disability amongst other comorbidities brought in for altered mental status with a presumed seizure.  Had a seizure yesterday that lasted about 3 minutes and the patient was postictal for about 30 minutes.  After that was back to her baseline.  Found altered again this morning without any clear explanation. When I went to assess her, she was laying in bed with eyes open and tracking but as soon as I started talking to her as he closes eyes and resistive active eye opening.  This raises concern for some nonorganic/nonepileptic etiology of her presentation but given that she has moderate electro disability, history of seizure disorders, I would favor observing her and getting some further workup done before labeling this is a psychogenic  episode.  Recommendations: Admit to hospitalist for observation Frequent neurochecks Seizure precautions Vimpat 200 twice daily Keppra 750 twice daily-use AED IV formulations until she follows commands and passes swallow screen. Routine EEG Will recommend UA and chest x-ray to ensure there is no underlying infection that might have lowered seizure threshold. Plan was discussed with ED provider Dr. Doran Durand and admitting hospitalist Dr. Core Neurology will follow  -- Milon Dikes, MD Neurologist [facility] Pager: [phone]

## 2022-07-21 NOTE — Assessment & Plan Note (Addendum)
Pt able to take meals. Still stop IVF. Continue to hold HTN meds. Pt is not hypertensive off meds.

## 2022-07-21 NOTE — ED Triage Notes (Signed)
Hx of seizures been out of medications for 2 days refill is ready now, normal talking and walking had a witnessed seizure yesterday today unwitnessed seizure incontinent in the bed found by caretaker postictal currently and postictal lasting longer then normal complains of HA by pointing still not verbal at this time bruising to the left upper arm unsure from what

## 2022-07-21 NOTE — Assessment & Plan Note (Addendum)
I think encephalopathy may be from recent seizure vs non-organic manifestation of behavior for secondary gain. Psych has seen patient and thinks she has catatonia.  She was not catatonic yesterday nor today. Pt exhibits voluntary movements but seems to catch herself moving her arms/legs and then suddenly stops moving them.  Psych has started IM/PO Ativan TID. Pt was tracking me with her eyes upon entering her room today on 2W.

## 2022-07-21 NOTE — Consult Note (Incomplete)
Neurology Consultation  Reason for Consult: Seizure, altered mental status Referring Physician: Dr. Dorna Mai  CC: Seizure, altered mental status  History is obtained from: Patient's chart, caregiver  HPI: Kristi King is a 42 y.o. female past medical history of moderate intellectual disability, mood disorder, seizures as well as concern for episodes being nonepileptic based on chart review from care everywhere from her neurologist outpatient-presented to the emergency department after being found altered.  She had a seizure yesterday that lasted about 3 minutes and she was postictal and sluggish and came to about 30 minutes later.  Today nobody witnessed the seizure but was presumed that she had a seizure because she laid unresponsive to voice and did not follow commands.  She has had trouble getting her medications and there was some prescription issues that prevented her from getting her antiepileptics for the past 2 days. No witnessed seizure activity in the ER. I was consulted over the phone-recommended IV lacosamide and levetiracetam, both of which were given but she continued to be very altered and off of her baseline which is usually talkative walking and being able to do her ADLs. I evaluated her and my recommendations are below. Patient has a documented history of headaches, seizures as well as a lot of seizure-like spells that have raise possibility of nonepileptic spells.  I did not see any EMU admissions verifying this.  ROS: Full ROS was performed and is negative except as noted in the HPI.  Past Medical History:  Diagnosis Date  . Moderate intellectual disability   . Mood disorder (HCC)   . Seizures (HCC)    ***  Family History  Problem Relation Age of Onset  . Epilepsy Mother    ***  Social History:   reports that she has never smoked. She has never used smokeless tobacco. She reports that she does not drink alcohol and does not use drugs. *** Medications No current  facility-administered medications for this encounter.  Current Outpatient Medications:  .  benzonatate (TESSALON) 100 MG capsule, Take 1 capsule (100 mg total) by mouth every 8 (eight) hours., Disp: 21 capsule, Rfl: 0 .  cetirizine (ZYRTEC) 10 MG tablet, Take 10 mg by mouth daily., Disp: , Rfl: 0 .  fluticasone (FLONASE) 50 MCG/ACT nasal spray, Place 1 spray into the nose daily as needed., Disp: , Rfl:  .  sucralfate (CARAFATE) 1 g tablet, Take 1 tablet (1 g total) by mouth 4 (four) times daily -  with meals and at bedtime for 14 days., Disp: 56 tablet, Rfl: 0 ***  Exam: Current vital signs: BP 100/60   Pulse 63   Temp 98.9 F (37.2 C) (Axillary)   Resp 18   Ht 5\' 2"  (1.575 m)   Wt 98.9 kg   SpO2 97%   BMI 39.88 kg/m  Vital signs in last 24 hours: Temp:  [98.2 F (36.8 C)-98.9 F (37.2 C)] 98.9 F (37.2 C) (07/13 1944) Pulse Rate:  [63-89] 63 (07/13 2245) Resp:  [16-41] 18 (07/13 1944) BP: (99-116)/(60-74) 100/60 (07/13 2245) SpO2:  [95 %-100 %] 97 % (07/13 2245) Weight:  [98.9 kg] 98.9 kg (07/13 1525) General: Drowsy, opens eyes off-and-on while I was not communicating with her but as soon as I started talking to her, kept her eyes closed forcibly. HEENT: Normocephalic atraumatic Lungs: Clear Cardiovascular: Regular rhythm Abdomen nondistended nontender Neurological exam Drowsy, Eyes open off-and-on while I was talking to the caretaker when I knocked on the door but as soon as I  turn the lights on and started interacting with her, kept her eyes forcibly closed.  Resisted eye opening.  Did not follow any commands Cranial nerves: Pupils equal round react to light, cannot assess extraocular movement due to her cooperation, did not blink to threat consistently from either side, face appears symmetric. Motor examination: Did not exhibit spontaneous movement but to noxious stimulation had strong withdrawal in all fours. Sensation: As above  Labs I have reviewed labs in epic and  the results pertinent to this consultation are: *** CBC    Component Value Date/Time   WBC 11.4 (H) 07/21/2022 1707   RBC 4.69 07/21/2022 1707   HGB 13.9 07/21/2022 1707   HCT 43.3 07/21/2022 1707   PLT 254 07/21/2022 1707   MCV 92.3 07/21/2022 1707   MCH 29.6 07/21/2022 1707   MCHC 32.1 07/21/2022 1707   RDW 14.0 07/21/2022 1707   LYMPHSABS 2.2 07/21/2022 1707   MONOABS 0.7 07/21/2022 1707   EOSABS 0.0 07/21/2022 1707   BASOSABS 0.1 07/21/2022 1707    CMP     Component Value Date/Time   NA 142 07/21/2022 1707   K 3.9 07/21/2022 1707   CL 109 07/21/2022 1707   CO2 17 (L) 07/21/2022 1707   GLUCOSE 73 07/21/2022 1707   BUN 10 07/21/2022 1707   CREATININE 0.65 07/21/2022 1707   CALCIUM 9.0 07/21/2022 1707   PROT 6.2 (L) 07/21/2022 1707   ALBUMIN 3.5 07/21/2022 1707   AST 15 07/21/2022 1707   ALT 10 07/21/2022 1707   ALKPHOS 48 07/21/2022 1707   BILITOT 0.2 (L) 07/21/2022 1707   GFRNONAA >60 07/21/2022 1707   GFRAA >60 09/20/2016 0542   Imaging I have reviewed the images obtained:  CT-head no acute changes  Assessment:  42 year old past history of seizures, episodes concerning for nonepileptic spells, moderate intellectual disability amongst other comorbidities brought in for altered mental status with a presumed seizure.  Had a seizure yesterday that lasted about 3 minutes and the patient was postictal for about 30 minutes.  After that was back to her baseline.  Found altered again this morning without any clear explanation. When I went to assess her, she was laying in bed with eyes open and tracking but as soon as I started talking to her as he closes eyes and resistive active eye opening.  This raises concern for some nonorganic/nonepileptic etiology of her presentation but given that she has moderate electro disability, history of seizure disorders, I would favor observing her and getting some further workup done before labeling this is a psychogenic  episode.  Recommendations: Admit to hospitalist for observation Frequent neurochecks Seizure precautions Vimpat 200 twice daily Keppra 750 twice daily-use AED IV formulations until she follows commands and passes swallow screen. Routine EEG Will recommend UA and chest x-ray to ensure there is no underlying infection that might have lowered seizure threshold. Plan was discussed with ED provider Dr. Doran Durand and admitting hospitalist Dr. Core   -- Milon Dikes, MD Neurologist [facility] Pager: [phone]
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
## 2022-07-21 NOTE — Assessment & Plan Note (Addendum)
Seizure disorder, poorly controlled. Likely due to recent lapse in delivery of her chronic AEDs. Pt's caregiver states pt has adequate supply of all 3 of her AEDs now. CT head was unremarkable, had seizure in the setting of not receiving the home Vimpat Admitting hospitalist discussed with neurologist and does not think nonconvulsive status rather thinks behavioral, to continue home meds via IV at one-to-one dosing. EEG negative for seizure.  Continue with oral keppra 1500 mg bid and oral vimpat 200 mg bid.

## 2022-07-21 NOTE — ED Provider Notes (Signed)
Care of patient received from prior provider at 9:19 PM, please see their note for complete H/P and care plan.  Received handoff per ED course.  Clinical Course as of 07/21/22 2119  Sat Jul 21, 2022  2118 Stable IDD 88 YOF with AMS.  Expected postictal, missed meds for a few days.  Family at bedside Neurology aware. Reassessing at 10PM and neuro to see then. [CC]    Clinical Course User Index [CC] Glyn Ade, MD    Reassessment: Neurology reassessed at bedside.  They stated patient should be admitted for observation overnight with plan for EEG overnight.  Disposition:   Based on the above findings, I believe this patient is stable for admission.    Patient/family educated about specific findings on our evaluation and explained exact reasons for admission.  Patient/family educated about clinical situation and time was allowed to answer questions.   Admission team communicated with and agreed with need for admission. Patient admitted. Patient ready to move at this time.     Emergency Department Medication Summary:   Medications  levETIRAcetam (KEPPRA) IVPB 1500 mg/ 100 mL premix (0 mg Intravenous Stopped 07/21/22 2021)  lacosamide (VIMPAT) 200 mg in sodium chloride 0.9 % 25 mL IVPB (0 mg Intravenous Stopped 07/21/22 2052)            Glyn Ade, MD 07/21/22 2318
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
## 2022-07-22 ENCOUNTER — Observation Stay (HOSPITAL_COMMUNITY): Payer: Medicare Other

## 2022-07-22 DIAGNOSIS — E876 Hypokalemia: Secondary | ICD-10-CM | POA: Diagnosis present

## 2022-07-22 DIAGNOSIS — F71 Moderate intellectual disabilities: Secondary | ICD-10-CM | POA: Diagnosis present

## 2022-07-22 DIAGNOSIS — F79 Unspecified intellectual disabilities: Secondary | ICD-10-CM

## 2022-07-22 DIAGNOSIS — E872 Acidosis, unspecified: Secondary | ICD-10-CM

## 2022-07-22 DIAGNOSIS — F061 Catatonic disorder due to known physiological condition: Secondary | ICD-10-CM | POA: Diagnosis present

## 2022-07-22 DIAGNOSIS — G9340 Encephalopathy, unspecified: Secondary | ICD-10-CM

## 2022-07-22 DIAGNOSIS — Z820 Family history of epilepsy and other diseases of the nervous system: Secondary | ICD-10-CM | POA: Diagnosis not present

## 2022-07-22 DIAGNOSIS — F419 Anxiety disorder, unspecified: Secondary | ICD-10-CM | POA: Diagnosis present

## 2022-07-22 DIAGNOSIS — F319 Bipolar disorder, unspecified: Secondary | ICD-10-CM

## 2022-07-22 DIAGNOSIS — E7800 Pure hypercholesterolemia, unspecified: Secondary | ICD-10-CM | POA: Diagnosis present

## 2022-07-22 DIAGNOSIS — G40909 Epilepsy, unspecified, not intractable, without status epilepticus: Secondary | ICD-10-CM | POA: Diagnosis present

## 2022-07-22 DIAGNOSIS — G43909 Migraine, unspecified, not intractable, without status migrainosus: Secondary | ICD-10-CM | POA: Diagnosis present

## 2022-07-22 DIAGNOSIS — Z888 Allergy status to other drugs, medicaments and biological substances status: Secondary | ICD-10-CM | POA: Diagnosis not present

## 2022-07-22 DIAGNOSIS — I10 Essential (primary) hypertension: Secondary | ICD-10-CM

## 2022-07-22 DIAGNOSIS — Z765 Malingerer [conscious simulation]: Secondary | ICD-10-CM | POA: Diagnosis not present

## 2022-07-22 DIAGNOSIS — Z79899 Other long term (current) drug therapy: Secondary | ICD-10-CM | POA: Diagnosis not present

## 2022-07-22 DIAGNOSIS — F919 Conduct disorder, unspecified: Secondary | ICD-10-CM | POA: Diagnosis present

## 2022-07-22 DIAGNOSIS — R569 Unspecified convulsions: Secondary | ICD-10-CM | POA: Diagnosis not present

## 2022-07-22 DIAGNOSIS — Z6841 Body Mass Index (BMI) 40.0 and over, adult: Secondary | ICD-10-CM | POA: Diagnosis not present

## 2022-07-22 LAB — CBC
HCT: 40.4 % (ref 36.0–46.0)
HCT: 40.5 % (ref 36.0–46.0)
Hemoglobin: 13.1 g/dL (ref 12.0–15.0)
Hemoglobin: 13.1 g/dL (ref 12.0–15.0)
MCH: 29.8 pg (ref 26.0–34.0)
MCH: 30.1 pg (ref 26.0–34.0)
MCHC: 32.3 g/dL (ref 30.0–36.0)
MCHC: 32.4 g/dL (ref 30.0–36.0)
MCV: 91.8 fL (ref 80.0–100.0)
MCV: 93.1 fL (ref 80.0–100.0)
Platelets: 233 10*3/uL (ref 150–400)
Platelets: 237 10*3/uL (ref 150–400)
RBC: 4.35 MIL/uL (ref 3.87–5.11)
RBC: 4.4 MIL/uL (ref 3.87–5.11)
RDW: 13.9 % (ref 11.5–15.5)
RDW: 14 % (ref 11.5–15.5)
WBC: 7.5 10*3/uL (ref 4.0–10.5)
WBC: 8.3 10*3/uL (ref 4.0–10.5)
nRBC: 0 % (ref 0.0–0.2)
nRBC: 0 % (ref 0.0–0.2)

## 2022-07-22 LAB — COMPREHENSIVE METABOLIC PANEL
ALT: 16 U/L (ref 0–44)
AST: 16 U/L (ref 15–41)
Albumin: 3.1 g/dL — ABNORMAL LOW (ref 3.5–5.0)
Alkaline Phosphatase: 44 U/L (ref 38–126)
Anion gap: 8 (ref 5–15)
BUN: 8 mg/dL (ref 6–20)
CO2: 18 mmol/L — ABNORMAL LOW (ref 22–32)
Calcium: 8.7 mg/dL — ABNORMAL LOW (ref 8.9–10.3)
Chloride: 113 mmol/L — ABNORMAL HIGH (ref 98–111)
Creatinine, Ser: 0.68 mg/dL (ref 0.44–1.00)
GFR, Estimated: >60 mL/min (ref >60)
Glucose, Bld: 81 mg/dL (ref 70–99)
Potassium: 3.1 mmol/L — ABNORMAL LOW (ref 3.5–5.1)
Sodium: 139 mmol/L (ref 135–145)
Total Bilirubin: 0.6 mg/dL (ref 0.3–1.2)
Total Protein: 5.9 g/dL — ABNORMAL LOW (ref 6.5–8.1)

## 2022-07-22 LAB — BLOOD GAS, VENOUS
Acid-base deficit: 6.6 mmol/L — ABNORMAL HIGH (ref 0.0–2.0)
Bicarbonate: 18.3 mmol/L — ABNORMAL LOW (ref 20.0–28.0)
O2 Saturation: 83.6 %
Patient temperature: 37
pCO2, Ven: 34 mmHg — ABNORMAL LOW (ref 44–60)
pH, Ven: 7.34 (ref 7.25–7.43)
pO2, Ven: 47 mmHg — ABNORMAL HIGH (ref 32–45)

## 2022-07-22 LAB — CREATININE, SERUM
Creatinine, Ser: 0.69 mg/dL (ref 0.44–1.00)
GFR, Estimated: >60 mL/min (ref >60)

## 2022-07-22 LAB — LACTIC ACID, PLASMA: Lactic Acid, Venous: 0.6 mmol/L (ref 0.5–1.9)

## 2022-07-22 LAB — AMMONIA: Ammonia: 15 umol/L (ref 9–35)

## 2022-07-22 LAB — HCG, SERUM, QUALITATIVE: Preg, Serum: NEGATIVE

## 2022-07-22 LAB — VITAMIN B12: Vitamin B-12: 156 pg/mL — ABNORMAL LOW (ref 180–914)

## 2022-07-22 LAB — TSH: TSH: 0.622 u[IU]/mL (ref 0.350–4.500)

## 2022-07-22 LAB — PROTIME-INR
INR: 1.1 (ref 0.8–1.2)
Prothrombin Time: 14.2 seconds (ref 11.4–15.2)

## 2022-07-22 LAB — HIV ANTIBODY (ROUTINE TESTING W REFLEX): HIV Screen 4th Generation wRfx: NONREACTIVE

## 2022-07-22 MED ORDER — LEVETIRACETAM IN NACL 1500 MG/100ML IV SOLN
1500.0000 mg | Freq: Two times a day (BID) | INTRAVENOUS | Status: DC
  Administered 2022-07-23 (×2): 1500 mg via INTRAVENOUS
  Filled 2022-07-22 (×2): qty 100

## 2022-07-22 MED ORDER — LORAZEPAM 1 MG PO TABS
1.0000 mg | ORAL_TABLET | Freq: Three times a day (TID) | ORAL | Status: DC
  Administered 2022-07-23: 1 mg via ORAL
  Filled 2022-07-22: qty 1

## 2022-07-22 MED ORDER — POTASSIUM CHLORIDE 10 MEQ/100ML IV SOLN
10.0000 meq | INTRAVENOUS | Status: AC
  Administered 2022-07-22 (×3): 10 meq via INTRAVENOUS
  Filled 2022-07-22 (×3): qty 100

## 2022-07-22 MED ORDER — LORAZEPAM 2 MG/ML IJ SOLN
1.0000 mg | Freq: Three times a day (TID) | INTRAMUSCULAR | Status: DC
  Administered 2022-07-22 (×2): 1 mg via INTRAMUSCULAR
  Filled 2022-07-22 (×2): qty 1

## 2022-07-22 MED ORDER — LACTATED RINGERS IV SOLN: INTRAVENOUS | Status: DC

## 2022-07-22 MED ORDER — GEMFIBROZIL 600 MG PO TABS
600.0000 mg | ORAL_TABLET | Freq: Two times a day (BID) | ORAL | Status: DC
  Administered 2022-07-23 – 2022-07-31 (×15): 600 mg via ORAL
  Filled 2022-07-22 (×19): qty 1

## 2022-07-22 MED ORDER — ORAL CARE MOUTH RINSE
15.0000 mL | Freq: Three times a day (TID) | OROMUCOSAL | Status: DC
  Administered 2022-07-22 – 2022-07-30 (×16): 15 mL via OROMUCOSAL

## 2022-07-22 NOTE — ED Notes (Signed)
Per caregiver from ALF, patient is typically alert, ambulatory and verbal. Patient currently not verbal or ambulatory and intermittently answers questions by nodding or shaking her head.

## 2022-07-22 NOTE — Assessment & Plan Note (Addendum)
Resolved. With addition of PO Bicarb, will add po kcl.

## 2022-07-22 NOTE — Assessment & Plan Note (Addendum)
Has caregiver but functional at baseline. Talks a lot per caregiver and is ambulatory. Pt walks up and down stairs per her caregiver [name].  Pt lives with caregiver in the caregiver's home. Pt is part of a Assisted Family Living program by [facility] [place], [place].

## 2022-07-22 NOTE — Subjective & Objective (Addendum)
Reportedly ate meals yesterday and today. Pt able to verbalize today. When I asked her if she knew who her caregiver was, she responded "I know who my caregiver is". But then refused to give me the caregiver's name. Pt noted to have spontaneous voluntary movements of both arms/hands/legs/feet. Responded with a head nod that she had eaten breakfast. Neurology reached out today and stated that pt does not require neurologic evaluation.

## 2022-07-22 NOTE — Assessment & Plan Note (Signed)
Chronic. 

## 2022-07-22 NOTE — Procedures (Signed)
Patient Name: Kristi King  MRN: [id number]  Epilepsy Attending: Charlsie Quest  Referring Physician/Provider: Milon Dikes, MD  Date: 07/22/2022 Duration: 24.45 mins  Patient history: 42 year old past history of seizures, episodes concerning for nonepileptic spells, moderate intellectual disability amongst other comorbidities brought in for altered mental status with a presumed seizure. EEG to evaluate for seizure.  Level of alertness:  lethargic   AEDs during EEG study: LEV, LCM  Technical aspects: This EEG study was done with scalp electrodes positioned according to the 10-20 International system of electrode placement. Electrical activity was reviewed with band pass filter of 1-70Hz , sensitivity of 7 uV/mm, display speed of 73mm/sec with a 60Hz  notched filter applied as appropriate. EEG data were recorded continuously and digitally stored.  Video monitoring was available and reviewed as appropriate.  Description: No posterior dominant rhythm was seen. EEG showed near continuous generalized high amplitude 4-5Hz  theta slowing admixed with 9-12 Hz alpha activity.  Hyperventilation and photic stimulation were not performed.     ABNORMALITY - Continuous slow, generalized  IMPRESSION: This study is suggestive of moderate diffuse encephalopathy, nonspecific etiology. No seizures or epileptiform discharges were seen throughout the recording.  Please note lack of epileptiform activity during interictal EEG does not exclude the diagnosis of epilepsy.   [name]

## 2022-07-22 NOTE — Progress Notes (Signed)
TRH night cross cover note:   I was notified by [facility] that home med rec is now complete, and that the patient, at home, is prescribed Keppra 1500 mg p.o. twice daily, relative to existing inpatient order for Keppra 750 mg p.o. twice daily. Will change existing Keppra order to reflect home dosing of her Keppra (1500 mg po bid).      Newton Pigg, DO Hospitalist

## 2022-07-22 NOTE — Assessment & Plan Note (Addendum)
Hypercholesterolemia. Normal CK. On gemfibrozil.

## 2022-07-22 NOTE — ED Notes (Signed)
Patient able to swallow and drink without coughing or choking.

## 2022-07-22 NOTE — Assessment & Plan Note (Addendum)
Psych consulted. They will following. They started pt on IM/PO ativan yesterday. Given that I have verified that pt has hx of attention seeking behavior and this "catatonic" state has happened before with patient, I feel confident that pt's current behavior is due to secondary gain and not from seizures/post-ictal state. Pt is medically stable from my standpoint.
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
## 2022-07-22 NOTE — Consult Note (Signed)
42 year old patient who was admitted with altered mental status. Patient seen face to face but unable to participate in psychiatric evaluation. Patient is awake, alert, calm, not responding to verbal prompt but seems aware of her environment. 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  Recommendations: -Consider Ativan challenge if not contraindicated: Lorazepam 1 mg  IM/PO TID X 48 hrs  Plan: Psychiatric consult service will follow the patient tomorrow.  Thedore Mins, MD

## 2022-07-22 NOTE — Progress Notes (Signed)
EEG complete - results pending 

## 2022-07-22 NOTE — Hospital Course (Addendum)
HPI: 42 year old female with a past medical history of seizure disorder, hypertension, bipolar disorder, intellectual disability who presents to the emergency department accompanied by her caregiver [name] ([phone]), [name] provides history at the bedside that the patient had a seizure yesterday at approximately noon for 3 minutes time and then subsequently had a postictal state for approximately half an hour however when the patient woke up this morning covered in urine the patient has been unresponsive to tactile or verbal stimuli and at baseline patient walks up and down stairs talks and showers.  Prior to this the caregiver does note increased behavioral symptoms namely the patient jumped out of a moving car with a previous caregiver.     Of note the patient has not had her Vimpat for the last 2 days as per the caregiver stating that the pharmacy had run out of the medication.  Case discussed with the ED physician and neurology has already been consulted and plans for overnight EEG although initial impression was behavioral rather than nonconvulsive status etc.   In the emergency department the patient received lacosamide 200 mg IV as well as Keppra 1500 mg IV.  As per the caregiver the patient has consistently been full code and voiced this on multiple occasions in the past as well.  Significant Events: Pt admitted 07-21-2022 Pt prepped for discharge 07-23-2022. Pt noted to have spontaneous voluntary movements and would intermittently answer questions.  Uncapped, unmarked syringe with needle found in her bed. UDS negative. Pt refused to walk or take oral meds. Discharge canceled.  Significant Labs: Normal ammonia Normal CK  Significant Imaging Studies: CT  head negative for acute abnormality  Antibiotic Therapy:   Procedures: EEG that showed moderate diffuse encephalopathy, nonspecific etiology. No seizures or epileptiform discharges were seen throughout the recording.    Consultants: Neurology Psych

## 2022-07-22 NOTE — ED Notes (Signed)
Writer checked to see if patient soiled self. Patient's clothing is dry at this time.

## 2022-07-22 NOTE — ED Notes (Signed)
ED TO INPATIENT HANDOFF REPORT  ED Nurse Name and Phone #: Donny Pique, RN [phone]  S Name/Age/Gender Kristi King 41 y.o. female Room/Bed: 001C/001C  Code Status   Code Status: Full Code  Home/SNF/Other Boarding Home     Is this baseline? No   Triage Complete: Triage complete  Chief Complaint Seizure [facility]) [R56.9]  Triage Note Hx of seizures been out of medications for 2 days refill is ready now, normal talking and walking had a witnessed seizure yesterday today unwitnessed seizure incontinent in the bed found by caretaker postictal currently and postictal lasting longer then normal complains of HA by pointing still not verbal at this time bruising to the left upper arm unsure from what    Allergies Allergies  Allergen Reactions   Dilantin [Phenytoin]    Tegretol [Carbamazepine]     Level of Care/Admitting Diagnosis ED Disposition     ED Disposition  Admit   Condition  --   Comment  Hospital Area: [facility] [[id number]]  Level of Care: Med-Surg [16]  May admit patient to [facility] or [facility] if equivalent level of care is available:: No  Covid Evaluation: Asymptomatic - no recent exposure (last 10 days) testing not required  Diagnosis: Seizure [facility]) [[id number]]  Admitting Physician: Imogene Burn, ERIC [[id number]]  Attending Physician: Imogene Burn, ERIC [[id number]]  Certification:: I certify this patient will need inpatient services for at least 2 midnights  Estimated Length of Stay: 2          B Medical/Surgery History Past Medical History:  Diagnosis Date   Moderate intellectual disability    Mood disorder (HCC)    Seizures (HCC)    History reviewed. No pertinent surgical history.   A IV Location/Drains/Wounds Patient Lines/Drains/Airways Status     Active Line/Drains/Airways     Name Placement date Placement time Site Days   Peripheral IV 07/21/22 20 G Left Forearm 07/21/22  1522  Forearm  1            Intake/Output Last 24 hours  Intake/Output  Summary (Last 24 hours) at 07/22/2022 1532 Last data filed at 07/22/2022 1020 Gross per 24 hour  Intake 140.47 ml  Output --  Net 140.47 ml    Labs/Imaging Results for orders placed or performed during the hospital encounter of 07/21/22 (from the past 48 hour(s))  Comprehensive metabolic panel     Status: Abnormal   Collection Time: 07/21/22  5:07 PM  Result Value Ref Range   Sodium 142 135 - 145 mmol/L   Potassium 3.9 3.5 - 5.1 mmol/L   Chloride 109 98 - 111 mmol/L   CO2 17 (L) 22 - 32 mmol/L   Glucose, Bld 73 70 - 99 mg/dL    Comment: Glucose reference range applies only to samples taken after fasting for at least 8 hours.   BUN 10 6 - 20 mg/dL   Creatinine, Ser 9.60 0.44 - 1.00 mg/dL   Calcium 9.0 8.9 - 45.4 mg/dL   Total Protein 6.2 (L) 6.5 - 8.1 g/dL   Albumin 3.5 3.5 - 5.0 g/dL   AST 15 15 - 41 U/L   ALT 10 0 - 44 U/L   Alkaline Phosphatase 48 38 - 126 U/L   Total Bilirubin 0.2 (L) 0.3 - 1.2 mg/dL   GFR, Estimated >09 >81 mL/min    Comment: (NOTE) Calculated using the CKD-EPI Creatinine Equation (2021)    Anion gap 16 (H) 5 - 15    Comment: Performed at [facility]  Hospital Lab, [address] [place], [place] [address]  CBC with Differential/Platelet     Status: Abnormal   Collection Time: 07/21/22  5:07 PM  Result Value Ref Range   WBC 11.4 (H) 4.0 - 10.5 K/uL   RBC 4.69 3.87 - 5.11 MIL/uL   Hemoglobin 13.9 12.0 - 15.0 g/dL   HCT 60.4 54.0 - 98.1 %   MCV 92.3 80.0 - 100.0 fL   MCH 29.6 26.0 - 34.0 pg   MCHC 32.1 30.0 - 36.0 g/dL   RDW 19.1 47.8 - 29.5 %   Platelets 254 150 - 400 K/uL   nRBC 0.0 0.0 - 0.2 %   Neutrophils Relative % 74 %   Neutro Abs 8.3 (H) 1.7 - 7.7 K/uL   Lymphocytes Relative 20 %   Lymphs Abs 2.2 0.7 - 4.0 K/uL   Monocytes Relative 6 %   Monocytes Absolute 0.7 0.1 - 1.0 K/uL   Eosinophils Relative 0 %   Eosinophils Absolute 0.0 0.0 - 0.5 K/uL   Basophils Relative 0 %   Basophils Absolute 0.1 0.0 - 0.1 K/uL   Immature Granulocytes 0 %   Abs  Immature Granulocytes 0.03 0.00 - 0.07 K/uL    Comment: Performed at [facility], [address], [place], [place] [address]  Lactic acid, plasma     Status: None   Collection Time: 07/21/22  5:07 PM  Result Value Ref Range   Lactic Acid, Venous 1.3 0.5 - 1.9 mmol/L    Comment: Performed at [facility], [address], [place], [place] [address]  CK     Status: None   Collection Time: 07/21/22  5:07 PM  Result Value Ref Range   Total CK 151 38 - 234 U/L    Comment: Performed at [facility] Lab, [address], [place], [place] [address]  CBG monitoring, ED     Status: None   Collection Time: 07/21/22 10:18 PM  Result Value Ref Range   Glucose-Capillary 74 70 - 99 mg/dL    Comment: Glucose reference range applies only to samples taken after fasting for at least 8 hours.  hCG, serum, qualitative     Status: None   Collection Time: 07/21/22 11:52 PM  Result Value Ref Range   Preg, Serum NEGATIVE NEGATIVE    Comment: Performed at [facility] Lab, [address], [place], [place] [address]  Blood gas, venous     Status: Abnormal   Collection Time: 07/21/22 11:52 PM  Result Value Ref Range   pH, Ven 7.34 7.25 - 7.43   pCO2, Ven 34 (L) 44 - 60 mmHg   pO2, Ven 47 (H) 32 - 45 mmHg   Bicarbonate 18.3 (L) 20.0 - 28.0 mmol/L   Acid-base deficit 6.6 (H) 0.0 - 2.0 mmol/L   O2 Saturation 83.6 %   Patient temperature 37.0     Comment: Performed at [facility] Lab, [address], [place], [place] [address]  HIV Antibody (routine testing w rflx)     Status: None   Collection Time: 07/21/22 11:52 PM  Result Value Ref Range   HIV Screen 4th Generation wRfx Non Reactive Non Reactive    Comment: Performed at [facility], [address], [place], [place] [address]  CBC     Status: None   Collection Time: 07/21/22 11:52 PM  Result Value Ref Range   WBC 8.3 4.0 - 10.5 K/uL   RBC 4.35 3.87 -  5.11 MIL/uL   Hemoglobin 13.1 12.0 - 15.0 g/dL   HCT 16.1 09.6 - 04.5 %    MCV 93.1 80.0 - 100.0 fL   MCH 30.1 26.0 - 34.0 pg   MCHC 32.3 30.0 - 36.0 g/dL   RDW 40.9 81.1 - 91.4 %   Platelets 237 150 - 400 K/uL   nRBC 0.0 0.0 - 0.2 %    Comment: Performed at [facility] Lab, [address], [place], [place] [address]  Creatinine, serum     Status: None   Collection Time: 07/21/22 11:52 PM  Result Value Ref Range   Creatinine, Ser 0.69 0.44 - 1.00 mg/dL   GFR, Estimated >62 >13 mL/min    Comment: (NOTE) Calculated using the CKD-EPI Creatinine Equation (2021) Performed at [facility], [address] [place], [place] [address]   CBC     Status: None   Collection Time: 07/22/22  4:33 AM  Result Value Ref Range   WBC 7.5 4.0 - 10.5 K/uL   RBC 4.40 3.87 - 5.11 MIL/uL   Hemoglobin 13.1 12.0 - 15.0 g/dL   HCT 84.6 96.2 - 95.2 %   MCV 91.8 80.0 - 100.0 fL   MCH 29.8 26.0 - 34.0 pg   MCHC 32.4 30.0 - 36.0 g/dL   RDW 84.1 32.4 - 40.1 %   Platelets 233 150 - 400 K/uL   nRBC 0.0 0.0 - 0.2 %    Comment: Performed at [facility] Lab, [address] [place], [place] [address]  Comprehensive metabolic panel     Status: Abnormal   Collection Time: 07/22/22  4:33 AM  Result Value Ref Range   Sodium 139 135 - 145 mmol/L   Potassium 3.1 (L) 3.5 - 5.1 mmol/L   Chloride 113 (H) 98 - 111 mmol/L   CO2 18 (L) 22 - 32 mmol/L   Glucose, Bld 81 70 - 99 mg/dL    Comment: Glucose reference range applies only to samples taken after fasting for at least 8 hours.   BUN 8 6 - 20 mg/dL   Creatinine, Ser 3.66 0.44 - 1.00 mg/dL   Calcium 8.7 (L) 8.9 - 10.3 mg/dL   Total Protein 5.9 (L) 6.5 - 8.1 g/dL   Albumin 3.1 (L) 3.5 - 5.0 g/dL   AST 16 15 - 41 U/L   ALT 16 0 - 44 U/L   Alkaline Phosphatase 44 38 - 126 U/L   Total Bilirubin 0.6 0.3 - 1.2 mg/dL   GFR, Estimated >44 >03 mL/min    Comment: (NOTE) Calculated using the CKD-EPI Creatinine Equation (2021)    Anion gap 8 5 - 15    Comment: Performed at [facility] Lab, [address], [place], [place]  [address]  TSH     Status: None   Collection Time: 07/22/22  4:33 AM  Result Value Ref Range   TSH 0.622 0.350 - 4.500 uIU/mL    Comment: Performed by a 3rd Generation assay with a functional sensitivity of <=0.01 uIU/mL. Performed at [facility] Lab, [address], [place], [place] [address]   Vitamin B12     Status: Abnormal   Collection Time: 07/22/22  4:33 AM  Result Value Ref Range   Vitamin B-12 156 (L) 180 - 914 pg/mL    Comment: (NOTE) This assay is not validated for testing neonatal or myeloproliferative syndrome specimens for Vitamin B12 levels. Performed at [facility] Lab, [address] [place],  [place] [address]   Lactic acid, plasma     Status: None   Collection Time: 07/22/22  4:37 AM  Result Value Ref Range   Lactic Acid, Venous 0.6 0.5 - 1.9 mmol/L    Comment: Performed at [facility] Lab, [address] [place], [place] [address]  Ammonia     Status: None   Collection Time: 07/22/22  4:37 AM  Result Value Ref Range   Ammonia 15 9 - 35 umol/L    Comment: Performed at [facility] Lab, [address] [place], [place] [address]  Protime-INR     Status: None   Collection Time: 07/22/22  4:37 AM  Result Value Ref Range   Prothrombin Time 14.2 11.4 - 15.2 seconds   INR 1.1 0.8 - 1.2    Comment: (NOTE) INR goal varies based on device and disease states. Performed at [facility] Lab, [address] [place], [place] [address]    EEG adult  Result Date: 07/22/2022 Charlsie Quest, MD     07/22/2022  7:36 AM Patient Name: SHAHADA KROTZ MRN: [id number] Epilepsy Attending: Charlsie Quest Referring Physician/Provider: Milon Dikes, MD Date: 07/22/2022 Duration: 24.45 mins Patient history: 42 year old past history of seizures, episodes concerning for nonepileptic spells, moderate intellectual disability amongst other comorbidities brought in for altered mental status with a presumed seizure. EEG to evaluate for seizure. Level of alertness:  lethargic  AEDs during EEG study: LEV, LCM Technical aspects: This EEG study was done with scalp electrodes positioned according to the 10-20 International system of electrode placement. Electrical activity was reviewed with band pass filter of 1-70Hz , sensitivity of 7 uV/mm, display speed of 27mm/sec with a 60Hz  notched filter applied as appropriate. EEG data were recorded continuously and digitally stored.  Video monitoring was available and reviewed as appropriate. Description: No posterior dominant rhythm was seen. EEG showed near continuous generalized high amplitude 4-5Hz  theta slowing admixed with 9-12 Hz alpha activity.  Hyperventilation and photic stimulation were not performed.   ABNORMALITY - Continuous slow, generalized IMPRESSION: This study is suggestive of moderate diffuse encephalopathy, nonspecific etiology. No seizures or epileptiform discharges were seen throughout the recording. Please note lack of epileptiform activity during interictal EEG does not exclude the diagnosis of epilepsy. Charlsie Quest   DG CHEST PORT 1 VIEW  Result Date: 07/22/2022 CLINICAL DATA:  Recent seizure activity EXAM: PORTABLE CHEST 1 VIEW COMPARISON:  07/18/2021 FINDINGS: Cardiac shadow is within normal limits. Lungs are well aerated bilaterally. Increased vascular congestion is noted without significant edema. No focal infiltrate is noted. No bony abnormality is seen. IMPRESSION: Mild CHF without edema. Electronically Signed   By: Alcide Clever M.D.   On: 07/22/2022 00:51   CT Head Wo Contrast  Result Date: 07/21/2022 CLINICAL DATA:  Mental status change, unknown cause seizure EXAM: CT HEAD WITHOUT CONTRAST TECHNIQUE: Contiguous axial images were obtained from the base of the skull through the vertex without intravenous contrast. RADIATION DOSE REDUCTION: This exam was performed according to the departmental dose-optimization program which includes automated exposure control, adjustment of the mA and/or kV according to  patient size and/or use of iterative reconstruction technique. COMPARISON:  CT head 06/26/2020 FINDINGS: Brain: No evidence of large-territorial acute infarction. No parenchymal hemorrhage. No mass lesion. No extra-axial collection. No mass effect or midline shift. No hydrocephalus. Basilar cisterns are patent. Vascular: No hyperdense vessel. Skull: No acute fracture or focal lesion. Sinuses/Orbits: Paranasal sinuses and mastoid air cells are clear. The orbits are  unremarkable. Other: None. IMPRESSION: No acute intracranial abnormality. Electronically Signed   By: Tish Frederickson M.D.   On: 07/21/2022 18:29    Pending Labs Unresulted Labs (From admission, onward)     Start     Ordered   07/28/22 0500  Creatinine, serum  (enoxaparin (LOVENOX)    CrCl >/= 30 ml/min)  Weekly,   R     Comments: while on enoxaparin therapy    07/21/22 2351   07/23/22 0500  Comprehensive metabolic panel  Tomorrow morning,   R        07/22/22 1319   07/23/22 0500  Magnesium  Tomorrow morning,   R        07/22/22 1319   07/21/22 2347  Rapid urine drug screen (hospital performed)  ONCE - STAT,   STAT        07/21/22 2346   07/21/22 1923  Urine Culture  Once,   URGENT       Question:  Indication  Answer:  Altered mental status (if no other cause identified)   07/21/22 1923   07/21/22 1923  Urinalysis, Routine w reflex microscopic -Urine, Catheterized  Once,   URGENT       Question:  Specimen Source  Answer:  Urine, Catheterized   07/21/22 1923            Vitals/Pain Today's Vitals   07/22/22 0821 07/22/22 1015 07/22/22 1030 07/22/22 1214  BP: 114/64 114/64 106/62   Pulse: 72 73 79   Resp: (!) 21 (!) 21 (!) 22   Temp: 98.4 F (36.9 C)   98.3 F (36.8 C)  TempSrc: Axillary     SpO2: 98% 97% 98%   Weight:      Height:        Isolation Precautions No active isolations  Medications Medications  lacosamide (VIMPAT) 200 mg in sodium chloride 0.9 % 25 mL IVPB (0 mg Intravenous Stopped 07/22/22 0959)   levETIRAcetam (KEPPRA) 750 mg in sodium chloride 0.9 % 100 mL IVPB (0 mg Intravenous Stopped 07/22/22 1020)  Oral care mouth rinse (15 mLs Mouth Rinse Given 07/22/22 1518)  Oral care mouth rinse (has no administration in time range)  enoxaparin (LOVENOX) injection 40 mg (40 mg Subcutaneous Given 07/22/22 1020)  lactated ringers infusion ( Intravenous New Bag/Given 07/22/22 1517)  LORazepam (ATIVAN) tablet 1 mg ( Oral See Alternative 07/22/22 1314)    Or  LORazepam (ATIVAN) injection 1 mg (1 mg Intramuscular Given 07/22/22 1314)  potassium chloride 10 mEq in 100 mL IVPB (10 mEq Intravenous New Bag/Given 07/22/22 1518)  gemfibrozil (LOPID) tablet 600 mg (has no administration in time range)  levETIRAcetam (KEPPRA) IVPB 1500 mg/ 100 mL premix (0 mg Intravenous Stopped 07/21/22 2021)  lacosamide (VIMPAT) 200 mg in sodium chloride 0.9 % 25 mL IVPB (0 mg Intravenous Stopped 07/21/22 2052)    Mobility non-ambulatory     Focused Assessments    R Recommendations: See Admitting Provider Note  Report given to:   Additional Notes: Patient is usually verbal and ambulatory per caregiver from ALF but patient only nods intermittently to answer questions. Non ambulatory and not following commands consistently.

## 2022-07-23 DIAGNOSIS — I10 Essential (primary) hypertension: Secondary | ICD-10-CM | POA: Diagnosis not present

## 2022-07-23 DIAGNOSIS — F418 Other specified anxiety disorders: Secondary | ICD-10-CM | POA: Insufficient documentation

## 2022-07-23 DIAGNOSIS — R569 Unspecified convulsions: Secondary | ICD-10-CM | POA: Diagnosis not present

## 2022-07-23 DIAGNOSIS — F319 Bipolar disorder, unspecified: Secondary | ICD-10-CM | POA: Diagnosis not present

## 2022-07-23 DIAGNOSIS — G9340 Encephalopathy, unspecified: Secondary | ICD-10-CM | POA: Diagnosis not present

## 2022-07-23 LAB — COMPREHENSIVE METABOLIC PANEL
ALT: 15 U/L (ref 0–44)
AST: 15 U/L (ref 15–41)
Albumin: 3 g/dL — ABNORMAL LOW (ref 3.5–5.0)
Alkaline Phosphatase: 45 U/L (ref 38–126)
Anion gap: 13 (ref 5–15)
BUN: 7 mg/dL (ref 6–20)
CO2: 13 mmol/L — ABNORMAL LOW (ref 22–32)
Calcium: 8.5 mg/dL — ABNORMAL LOW (ref 8.9–10.3)
Chloride: 111 mmol/L (ref 98–111)
Creatinine, Ser: 0.67 mg/dL (ref 0.44–1.00)
GFR, Estimated: >60 mL/min (ref >60)
Glucose, Bld: 58 mg/dL — ABNORMAL LOW (ref 70–99)
Potassium: 3.5 mmol/L (ref 3.5–5.1)
Sodium: 137 mmol/L (ref 135–145)
Total Bilirubin: 0.8 mg/dL (ref 0.3–1.2)
Total Protein: 5.9 g/dL — ABNORMAL LOW (ref 6.5–8.1)

## 2022-07-23 LAB — MAGNESIUM: Magnesium: 1.7 mg/dL (ref 1.7–2.4)

## 2022-07-23 MED ORDER — ZONISAMIDE 100 MG PO CAPS
100.0000 mg | ORAL_CAPSULE | Freq: Two times a day (BID) | ORAL | Status: DC
  Administered 2022-07-24 – 2022-07-31 (×14): 100 mg via ORAL
  Filled 2022-07-23 (×18): qty 1

## 2022-07-23 MED ORDER — SODIUM BICARBONATE 650 MG PO TABS
650.0000 mg | ORAL_TABLET | Freq: Three times a day (TID) | ORAL | Status: DC
  Administered 2022-07-24 – 2022-07-31 (×21): 650 mg via ORAL
  Filled 2022-07-23 (×23): qty 1

## 2022-07-23 MED ORDER — POTASSIUM CHLORIDE CRYS ER 20 MEQ PO TBCR
20.0000 meq | EXTENDED_RELEASE_TABLET | Freq: Two times a day (BID) | ORAL | Status: DC
  Administered 2022-07-25 – 2022-07-31 (×12): 20 meq via ORAL
  Filled 2022-07-23 (×15): qty 1

## 2022-07-23 MED ORDER — POTASSIUM CHLORIDE CRYS ER 20 MEQ PO TBCR: 20.0000 meq | EXTENDED_RELEASE_TABLET | Freq: Every day | ORAL | 0 refills | Status: DC

## 2022-07-23 MED ORDER — ARIPIPRAZOLE 15 MG PO TABS: 15.0000 mg | ORAL_TABLET | Freq: Every day | ORAL | 0 refills | Status: DC

## 2022-07-23 MED ORDER — SODIUM BICARBONATE 650 MG PO TABS: 650.0000 mg | ORAL_TABLET | Freq: Three times a day (TID) | ORAL | 0 refills | Status: DC

## 2022-07-23 MED ORDER — LACOSAMIDE 200 MG PO TABS
200.0000 mg | ORAL_TABLET | Freq: Two times a day (BID) | ORAL | Status: DC
  Filled 2022-07-23 (×2): qty 1

## 2022-07-23 MED ORDER — LEVETIRACETAM 500 MG PO TABS
1500.0000 mg | ORAL_TABLET | Freq: Two times a day (BID) | ORAL | Status: DC
  Filled 2022-07-23: qty 3

## 2022-07-23 NOTE — Progress Notes (Signed)
Pt's caregiver [name] came to bedside to pick patient up at 1530 for discharge. Writer and charge RN went into room to get patient changed and ready. Pt unable/unwilling to sit on side of bed. Pt had incontinent episode and upon cleaning the patient, writer found an empty, uncapped syringe. See Dr Nicky Pugh note. Writer called pharmacy and pharmacist stated that we didn't carry a syringe like that in the hospital. [name] also stated she didn't recognize the syringe. MD notified and came up to bedside to assess patient. Pt had spoken to RN during morning assessment. Only stated "I'm glad I came here" and response was very delayed. Pt was also nodding and shaking head to questions appropriately, but was not answering MD's questions or following commands during MD assessment. MD stated to hold discharge for the night and orders placed for urine drug screen. RN returned to room after MD left with NT to straight cath patient. Pt pulling at tv remote line, trying to get out of bed, not following commands, and restless. Pt bladder scan zero, so pt placed on fracture bedpan briefly. Pt unable to urinate at this time.

## 2022-07-23 NOTE — Progress Notes (Signed)
PROGRESS NOTE    Kristi King  ZOX:[id number] DOB: Jan 17, 1980 DOA: 07/21/2022 PCP: Leilani Able, MD  Subjective: Reportedly ate meals yesterday and today. Pt able to verbalize today. When I asked her if she knew who her caregiver was, she responded "I know who my caregiver is". But then refused to give me the caregiver's name. Pt noted to have spontaneous voluntary movements of both arms/hands/legs/feet. Responded with a head nod that she had eaten breakfast. Neurology reached out today and stated that pt does not require neurologic evaluation.   Hospital Course: HPI: 42 year old female with a past medical history of seizure disorder, hypertension, bipolar disorder, intellectual disability who presents to the emergency department accompanied by her caregiver [name] ([phone]), [name] provides history at the bedside that the patient had a seizure yesterday at approximately noon for 3 minutes time and then subsequently had a postictal state for approximately half an hour however when the patient woke up this morning covered in urine the patient has been unresponsive to tactile or verbal stimuli and at baseline patient walks up and down stairs talks and showers.  Prior to this the caregiver does note increased behavioral symptoms namely the patient jumped out of a moving car with a previous caregiver.     Of note the patient has not had her Vimpat for the last 2 days as per the caregiver stating that the pharmacy had run out of the medication.  Case discussed with the ED physician and neurology has already been consulted and plans for overnight EEG although initial impression was behavioral rather than nonconvulsive status etc.   In the emergency department the patient received lacosamide 200 mg IV as well as Keppra 1500 mg IV.  As per the caregiver the patient has consistently been full code and voiced this on multiple occasions in the past as well.  Significant Events:   Significant  Labs: Normal ammonia Normal CK  Significant Imaging Studies: CT  head negative for acute abnormality  Antibiotic Therapy:   Procedures: EEG that showed moderate diffuse encephalopathy, nonspecific etiology. No seizures or epileptiform discharges were seen throughout the recording.   Consultants: Neurology Psych     Assessment and Plan: * Seizure [facility]) Seizure disorder, poorly controlled CT head was unremarkable, had seizure in the setting of not receiving the home Vimpat Admitting hospitalist discussed with neurologist and does not think nonconvulsive status rather thinks behavioral, to continue home meds via IV at one-to-one dosing. EEG negative for seizure.  Change back to oral keppra 1500 mg bid and oral vimpat 200 mg bid.  Intellectual disability Has caregiver but functional at baseline speaking going up and down stairs per report.  Bipolar disorder [facility]) Psych consulted. They will following. They started pt on IM/PO ativan yesterday. Given that I have verified that pt has hx of attention seeking behavior and this "catatonic" state has happened before with patient, I feel confident that pt's current behavior is due to secondary gain and not from seizures/post-ictal state. Pt is medically stable from my standpoint.  Hypercholesterolemia Hypercholesterolemia. Normal CK. On gemfibrozil.  Metabolic acidosis Metabolic acidosis. Stable. I wonder if her seizure meds are doing this. Will add oral bicarb 650 mg tid.  Essential hypertension Pt able to take meals. Still stop IVF. Continue to hold HTN meds. Pt is not hypertensive off meds.  Acute encephalopathy I think encephalopathy may be from recent seizure vs non-organic manifestation of behavior for secondary gain. Psych has seen patient and thinks she has catatonia.  She was  not catatonic yesterday nor today. Pt exhibits voluntary movements but seems to catch herself moving her arms/legs and then suddenly stops moving them.   Psych has started IM/PO Ativan TID. Pt was tracking me with her eyes upon entering her room today on 2W.   Hypokalemia Resolved. With addition of PO Bicarb, will add po kcl.  Body mass index (BMI) 40.0-44.9, adult (HCC) Chronic.   DVT prophylaxis: Lovenox   Code Status: Full Code Family Communication: called and spoke with pt's caregiver [name]. Disposition Plan: return home Reason for continuing need for hospitalization: none. Medically stable for discharge.  Objective: Vitals:   07/22/22 1901 07/22/22 2009 07/23/22 0450 07/23/22 0751  BP: 123/67 (!) 119/56 120/62 119/65  Pulse: 83 86 90 91  Resp:  18 18 18   Temp: 98.6 F (37 C) 99.3 F (37.4 C)  98.5 F (36.9 C)  TempSrc:      SpO2: 98% 100% 98% 96%  Weight:      Height:        Intake/Output Summary (Last 24 hours) at 07/23/2022 1156 Last data filed at 07/23/2022 0407 Gross per 24 hour  Intake 2314.6 ml  Output --  Net 2314.6 ml   Filed Weights   07/21/22 1525  Weight: 98.9 kg    Examination:  Physical Exam Vitals and nursing note reviewed.  Constitutional:      Comments: Tracks Conservation officer, nature with her eyes the moment I enter her room  HENT:     Head: Normocephalic and atraumatic.     Nose: Nose normal.  Cardiovascular:     Rate and Rhythm: Normal rate and regular rhythm.  Pulmonary:     Effort: No respiratory distress.  Abdominal:     General: Bowel sounds are normal. There is no distension.     Tenderness: There is no abdominal tenderness.  Skin:    General: Skin is warm and dry.  Neurological:     Mental Status: She is alert.  Psychiatric:     Comments: Nods her head "yes" when asked if she ate breakfast. Refused to answer what she had for breakfast. After asking her multiple times if she knew her caregiver, pt finally answered "I know who my caregiver is", but then refused to give the caregiver's name.    Data Reviewed: I have personally reviewed following labs and imaging  studies  CBC: Recent Labs  Lab 07/21/22 1707 07/21/22 2352 07/22/22 0433  WBC 11.4* 8.3 7.5  NEUTROABS 8.3*  --   --   HGB 13.9 13.1 13.1  HCT 43.3 40.5 40.4  MCV 92.3 93.1 91.8  PLT 254 237 233   Basic Metabolic Panel: Recent Labs  Lab 07/21/22 1707 07/21/22 2352 07/22/22 0433 07/23/22 0801  NA 142  --  139 137  K 3.9  --  3.1* 3.5  CL 109  --  113* 111  CO2 17*  --  18* 13*  GLUCOSE 73  --  81 58*  BUN 10  --  8 7  CREATININE 0.65 0.69 0.68 0.67  CALCIUM 9.0  --  8.7* 8.5*  MG  --   --   --  1.7   GFR: Estimated Creatinine Clearance: 100.7 mL/min (by C-G formula based on SCr of 0.67 mg/dL). Liver Function Tests: Recent Labs  Lab 07/21/22 1707 07/22/22 0433 07/23/22 0801  AST 15 16 15   ALT 10 16 15   ALKPHOS 48 44 45  BILITOT 0.2* 0.6 0.8  PROT 6.2* 5.9* 5.9*  ALBUMIN 3.5  3.1* 3.0*    Recent Labs  Lab 08-15-2022 0437  AMMONIA 15   Coagulation Profile: Recent Labs  Lab 2022/08/15 0437  INR 1.1   Cardiac Enzymes: Recent Labs  Lab 07/21/22 1707  CKTOTAL 151   CBG: Recent Labs  Lab 07/21/22 2218  GLUCAP 74   Thyroid Function Tests: Recent Labs    2022/08/15 0433  TSH 0.622   Anemia Panel: Recent Labs    08-15-22 0433  VITAMINB12 156*   Sepsis Labs: Recent Labs  Lab 07/21/22 1707 Aug 15, 2022 0437  LATICACIDVEN 1.3 0.6    No results found for this or any previous visit (from the past 240 hour(s)).   Radiology Studies: EEG adult  Result Date: 2022/08/15 Charlsie Quest, MD     15-Aug-2022  7:36 AM Patient Name: JERALINE MARCINEK MRN: [id number] Epilepsy Attending: Charlsie Quest Referring Physician/Provider: Milon Dikes, MD Date: Aug 15, 2022 Duration: 24.45 mins Patient history: 42 year old past history of seizures, episodes concerning for nonepileptic spells, moderate intellectual disability amongst other comorbidities brought in for altered mental status with a presumed seizure. EEG to evaluate for seizure. Level of alertness:  lethargic  AEDs during EEG study: LEV, LCM Technical aspects: This EEG study was done with scalp electrodes positioned according to the 10-20 International system of electrode placement. Electrical activity was reviewed with band pass filter of 1-70Hz , sensitivity of 7 uV/mm, display speed of 22mm/sec with a 60Hz  notched filter applied as appropriate. EEG data were recorded continuously and digitally stored.  Video monitoring was available and reviewed as appropriate. Description: No posterior dominant rhythm was seen. EEG showed near continuous generalized high amplitude 4-5Hz  theta slowing admixed with 9-12 Hz alpha activity.  Hyperventilation and photic stimulation were not performed.   ABNORMALITY - Continuous slow, generalized IMPRESSION: This study is suggestive of moderate diffuse encephalopathy, nonspecific etiology. No seizures or epileptiform discharges were seen throughout the recording. Please note lack of epileptiform activity during interictal EEG does not exclude the diagnosis of epilepsy. Charlsie Quest   DG CHEST PORT 1 VIEW  Result Date: 08/15/2022 CLINICAL DATA:  Recent seizure activity EXAM: PORTABLE CHEST 1 VIEW COMPARISON:  07/18/2021 FINDINGS: Cardiac shadow is within normal limits. Lungs are well aerated bilaterally. Increased vascular congestion is noted without significant edema. No focal infiltrate is noted. No bony abnormality is seen. IMPRESSION: Mild CHF without edema. Electronically Signed   By: Alcide Clever M.D.   On: August 15, 2022 00:51   CT Head Wo Contrast  Result Date: 07/21/2022 CLINICAL DATA:  Mental status change, unknown cause seizure EXAM: CT HEAD WITHOUT CONTRAST TECHNIQUE: Contiguous axial images were obtained from the base of the skull through the vertex without intravenous contrast. RADIATION DOSE REDUCTION: This exam was performed according to the departmental dose-optimization program which includes automated exposure control, adjustment of the mA and/or kV according to  patient size and/or use of iterative reconstruction technique. COMPARISON:  CT head 06/26/2020 FINDINGS: Brain: No evidence of large-territorial acute infarction. No parenchymal hemorrhage. No mass lesion. No extra-axial collection. No mass effect or midline shift. No hydrocephalus. Basilar cisterns are patent. Vascular: No hyperdense vessel. Skull: No acute fracture or focal lesion. Sinuses/Orbits: Paranasal sinuses and mastoid air cells are clear. The orbits are unremarkable. Other: None. IMPRESSION: No acute intracranial abnormality. Electronically Signed   By: Tish Frederickson M.D.   On: 07/21/2022 18:29     Scheduled Meds:  enoxaparin (LOVENOX) injection  40 mg Subcutaneous Daily   gemfibrozil  600 mg Oral BID AC  lacosamide  200 mg Oral BID   levETIRAcetam  1,500 mg Oral BID   mouth rinse  15 mL Mouth Rinse TID   potassium chloride  20 mEq Oral BID   sodium bicarbonate  650 mg Oral TID   zonisamide  100 mg Oral BID   Continuous Infusions:   LOS: 1 day    Time spent: 40 minutes    Carollee Herter, DO  [facility]  07/23/2022, 11:56 AM

## 2022-07-23 NOTE — Progress Notes (Signed)
Call back received from [place]. She stated she will be here around 3-3:30pm for patient pick up.

## 2022-07-23 NOTE — Progress Notes (Signed)
Call placed to [name] [phone]) to coordinate pickup for discharge. Voicemail left for call back.

## 2022-07-23 NOTE — Progress Notes (Signed)
   Pt's caregiver [name] called me back. Pt lives at home with caregiver. Pt is in an Assisted Family Living program, NOT an assisted living facility. Pt is a client of [facility]. Caregiver states that pt has a history of "attention-seeking behavior" according to caregiver. Pt will notify mimic other people symptoms as her own according to caregiver. Caregiver states pt goes to adult day program and pt will mimic other people's symptoms in order to gain attention. Pt will often ask if she can have surgery or other procedures in order to get attention.  Caregiver states pt has recently started to urinate on herself in order to gain attention.  Recent lapse in AEDs due to outpatient pharmacy not having her meds. Caregiver states that meds were deliver to house yesterday and caregiver has supplies of keppra, vimpat and zonegran.  With this new information, it is very likely that pt's current condition is due to attention seeking behavior. Further hospitalization is not warranted.  Caregiver notified that pt will be discharged to home today.  Carollee Herter, DO [facility]

## 2022-07-23 NOTE — Progress Notes (Signed)
   Asked by bedside RN to re-evaluate pt prior to discharge. Pt's caregiver [name] came to hospital to pick pt up. Pt unable/unwilling to stand up.  Syringe was found in her bed. RN has verified with help of pharmacy that hospital does not carry syringes with this type in the whole hospital. Pt's pupils are reactive. She spontaneously moves her head to avoid direct flashlight in her eyes. Moves her arms/hands spontaneously. Refuses to speak despite talking to RN just a few hours ago.  Will hold discharge and re-evaluate in the AM.  Will get straight cath and urine drug screen.  Other possibilities include pt's recent IV keppra and vimpat infusions. Pt was out of vimpat for 48 hours prior to her reported seizure at home.    Carollee Herter, DO [facility]

## 2022-07-23 NOTE — Progress Notes (Signed)
   Multiple attempts in trying to contact pt's caregiver [name] [place]([phone]). Phone call goes immediately to voicemail. Unclear if her phone is turned off or if hospital number is being blocked.  Will refer to CM. May need APS referral.  Carollee Herter, DO [facility]

## 2022-07-23 NOTE — Progress Notes (Signed)
Transition of Care [facility]) - Inpatient Brief Assessment   Patient Details  Name: Kristi King MRN: [id number] Date of Birth: 05-31-1980  [facility] CM/SW Contact:    Janae Bridgeman, RN Phone Number: 07/23/2022, 12:34 PM   Clinical Narrative: CM spoke with the attending physician and patient lives at home and care is provided through [facility].  [name] is the point of contact and care giver.  [facility] nursing was informed to call the caregiver and review discharge instructions and provide transportation to home today.   Transition of Care Asessment: Insurance and Status: (P) Insurance coverage has been reviewed Patient has primary care physician: (P) Yes Home environment has been reviewed: (P) Yes Prior level of function:: (P) Care is provided through [facility] at the home Prior/Current Home Services: (P) Current home services Social Determinants of Health Reivew: (P) SDOH reviewed interventions complete Readmission risk has been reviewed: (P) Yes Transition of care needs: (P) no transition of care needs at this time

## 2022-07-23 NOTE — Discharge Summary (Signed)
Physician Discharge Summary   Patient name: Kristi King  Admit date:     07/21/2022  Discharge date: 07/23/2022  Attending Physician: Imogene Burn, Karanveer Ramakrishnan [[id number]]  Discharge Physician: Carollee Herter   PCP: Leilani Able, MD     Recommendations at discharge: f/u with PCP and neurologist at discharge within 2-4 weeks.  Discharge Diagnoses Principal Problem:   Seizure [facility]) Active Problems:   Acute encephalopathy   Essential hypertension   Metabolic acidosis   Hypercholesterolemia   Bipolar disorder (HCC)   Intellectual disability   Body mass index (BMI) 40.0-44.9, adult (HCC)   Hypokalemia   Anxiety disorder with care-seeking behavior   Resolved Diagnoses Resolved Problems:   * No resolved hospital problems. [place] [facility]   HPI: 42 year old female with a past medical history of seizure disorder, hypertension, bipolar disorder, intellectual disability who presents to the emergency department accompanied by her caregiver [name] ([phone]), [name] provides history at the bedside that the patient had a seizure yesterday at approximately noon for 3 minutes time and then subsequently had a postictal state for approximately half an hour however when the patient woke up this morning covered in urine the patient has been unresponsive to tactile or verbal stimuli and at baseline patient walks up and down stairs talks and showers.  Prior to this the caregiver does note increased behavioral symptoms namely the patient jumped out of a moving car with a previous caregiver.     Of note the patient has not had her Vimpat for the last 2 days as per the caregiver stating that the pharmacy had run out of the medication.  Case discussed with the ED physician and neurology has already been consulted and plans for overnight EEG although initial impression was behavioral rather than nonconvulsive status etc.   In the emergency department the patient received lacosamide 200 mg IV as well as Keppra 1500  mg IV.  As per the caregiver the patient has consistently been full code and voiced this on multiple occasions in the past as well.  Significant Events:   Significant Labs: Normal ammonia Normal CK  Significant Imaging Studies: CT  head negative for acute abnormality  Antibiotic Therapy:   Procedures: EEG that showed moderate diffuse encephalopathy, nonspecific etiology. No seizures or epileptiform discharges were seen throughout the recording.   Consultants: Neurology Psych    Assessment and Plan: * Seizure [facility]) Seizure disorder, poorly controlled. Likely due to recent lapse in delivery of her chronic AEDs. Pt's caregiver states pt has adequate supply of all 3 of her AEDs now. CT head was unremarkable, had seizure in the setting of not receiving the home Vimpat Admitting hospitalist discussed with neurologist and does not think nonconvulsive status rather thinks behavioral, to continue home meds via IV at one-to-one dosing. EEG negative for seizure.  Change back to oral keppra 1500 mg bid and oral vimpat 200 mg bid.  Intellectual disability Has caregiver but functional at baseline speaking going up and down stairs per report.  Bipolar disorder [facility]) Psych consulted. They will following. They started pt on IM/PO ativan yesterday. Given that I have verified that pt has hx of attention seeking behavior and this "catatonic" state has happened before with patient, I feel confident that pt's current behavior is due to secondary gain and not from seizures/post-ictal state. Pt is medically stable from my standpoint.  Hypercholesterolemia Hypercholesterolemia. Normal CK. On gemfibrozil.  Metabolic acidosis Metabolic acidosis. Stable. I wonder if her seizure meds are doing this. Will add  oral bicarb 650 mg tid.  Essential hypertension Pt able to take meals. Still stop IVF. Continue to hold HTN meds. Pt is not hypertensive off meds.  Acute encephalopathy I think encephalopathy  may be from recent seizure vs non-organic manifestation of behavior for secondary gain. Psych has seen patient and thinks she has catatonia.  She was not catatonic yesterday nor today. Pt exhibits voluntary movements but seems to catch herself moving her arms/legs and then suddenly stops moving them.  Psych has started IM/PO Ativan TID. Pt was tracking me with her eyes upon entering her room today on 2W.   Anxiety disorder with care-seeking behavior Patient's caregiver [name] with whom the patient lives under a assisted family living agreement through [facility] states the patient has a history of attention seeking behavior.  Patient goes to adult daycare and mimics many of the other clients symptoms in order to gain attention.  Patient has recently started urinating on herself in order to gain attention.  Caregiver states the patient will often ask for surgery or other procedures in order to gain attention.  Hypokalemia Resolved. With addition of PO Bicarb, will add po kcl.  Body mass index (BMI) 40.0-44.9, adult (HCC) Chronic.   Condition at discharge: stable  Exam Physical Exam Vitals and nursing note reviewed.  Pulmonary:     Effort: Pulmonary effort is normal. No respiratory distress.  Skin:    General: Skin is warm and dry.  Neurological:     Mental Status: She is alert.     Comments: Tracks with her eyes this Clinical research associate across the room. Noted to have spontaneous movements of both arms/legs/feet/hands while not being observed but suddenly stops moving when she notes this writer watching her.    \  Disposition: Home  Discharge time: greater than 30 minutes. Allergies as of 07/23/2022       Reactions   Dilantin [phenytoin]    Tegretol [carbamazepine]         Medication List     TAKE these medications    ARIPiprazole 15 MG tablet Commonly known as: ABILIFY Take 1 tablet (15 mg total) by mouth daily. What changed: how much to take    cetirizine 10 MG tablet Commonly known as: ZYRTEC Take 10 mg by mouth daily.   Cholecalciferol 50 MCG (2000 UT) Tabs Take 2,000 Units by mouth daily.   ciclopirox 8 % solution Commonly known as: PENLAC Apply 1 Application topically daily. Remove after 7 days.   dexlansoprazole 60 MG capsule Commonly known as: DEXILANT Take 60 mg by mouth daily.   gabapentin 300 MG capsule Commonly known as: NEURONTIN Take 300 mg by mouth daily.   gabapentin 400 MG capsule Commonly known as: NEURONTIN Take 400 mg by mouth at bedtime.   gemfibrozil 600 MG tablet Commonly known as: LOPID Take 600 mg by mouth 2 (two) times daily before a meal.   lacosamide 200 MG Tabs tablet Commonly known as: VIMPAT Take 200 mg by mouth 2 (two) times daily.   levETIRAcetam 750 MG tablet Commonly known as: KEPPRA Take 1,500 mg by mouth 2 (two) times daily.   Melatonin 10 MG Tabs Take 10 mg by mouth at bedtime.   mirtazapine 15 MG tablet Commonly known as: REMERON Take 15 mg by mouth daily.   potassium chloride SA 20 MEQ tablet Commonly known as: KLOR-CON M Take 1 tablet (20 mEq total) by mouth daily.   sodium bicarbonate 650 MG tablet Take 1 tablet (650 mg  total) by mouth 3 (three) times daily.   SYSTANE COMPLETE OP Apply 1 drop to eye as needed (Dry eye).   zonisamide 100 MG capsule Commonly known as: ZONEGRAN Take 100 mg by mouth 2 (two) times daily.        EEG adult  Result Date: 07/22/2022 Charlsie Quest, MD     07/22/2022  7:36 AM Patient Name: Kristi King MRN: [id number] Epilepsy Attending: Charlsie Quest Referring Physician/Provider: Milon Dikes, MD Date: 07/22/2022 Duration: 24.45 mins Patient history: 42 year old past history of seizures, episodes concerning for nonepileptic spells, moderate intellectual disability amongst other comorbidities brought in for altered mental status with a presumed seizure. EEG to evaluate for seizure. Level of alertness:  lethargic AEDs during  EEG study: LEV, LCM Technical aspects: This EEG study was done with scalp electrodes positioned according to the 10-20 International system of electrode placement. Electrical activity was reviewed with band pass filter of 1-70Hz , sensitivity of 7 uV/mm, display speed of 58mm/sec with a 60Hz  notched filter applied as appropriate. EEG data were recorded continuously and digitally stored.  Video monitoring was available and reviewed as appropriate. Description: No posterior dominant rhythm was seen. EEG showed near continuous generalized high amplitude 4-5Hz  theta slowing admixed with 9-12 Hz alpha activity.  Hyperventilation and photic stimulation were not performed.   ABNORMALITY - Continuous slow, generalized IMPRESSION: This study is suggestive of moderate diffuse encephalopathy, nonspecific etiology. No seizures or epileptiform discharges were seen throughout the recording. Please note lack of epileptiform activity during interictal EEG does not exclude the diagnosis of epilepsy. Charlsie Quest   DG CHEST PORT 1 VIEW  Result Date: 07/22/2022 CLINICAL DATA:  Recent seizure activity EXAM: PORTABLE CHEST 1 VIEW COMPARISON:  07/18/2021 FINDINGS: Cardiac shadow is within normal limits. Lungs are well aerated bilaterally. Increased vascular congestion is noted without significant edema. No focal infiltrate is noted. No bony abnormality is seen. IMPRESSION: Mild CHF without edema. Electronically Signed   By: Alcide Clever M.D.   On: 07/22/2022 00:51   CT Head Wo Contrast  Result Date: 07/21/2022 CLINICAL DATA:  Mental status change, unknown cause seizure EXAM: CT HEAD WITHOUT CONTRAST TECHNIQUE: Contiguous axial images were obtained from the base of the skull through the vertex without intravenous contrast. RADIATION DOSE REDUCTION: This exam was performed according to the departmental dose-optimization program which includes automated exposure control, adjustment of the mA and/or kV according to patient size  and/or use of iterative reconstruction technique. COMPARISON:  CT head 06/26/2020 FINDINGS: Brain: No evidence of large-territorial acute infarction. No parenchymal hemorrhage. No mass lesion. No extra-axial collection. No mass effect or midline shift. No hydrocephalus. Basilar cisterns are patent. Vascular: No hyperdense vessel. Skull: No acute fracture or focal lesion. Sinuses/Orbits: Paranasal sinuses and mastoid air cells are clear. The orbits are unremarkable. Other: None. IMPRESSION: No acute intracranial abnormality. Electronically Signed   By: Tish Frederickson M.D.   On: 07/21/2022 18:29   Results for orders placed or performed during the hospital encounter of 07/18/21  SARS Coronavirus 2 by RT PCR (hospital order, performed in [facility] hospital lab) *cepheid single result test* Anterior Nasal Swab     Status: None   Collection Time: 07/18/21  4:22 PM   Specimen: Anterior Nasal Swab  Result Value Ref Range Status   SARS Coronavirus 2 by RT PCR NEGATIVE NEGATIVE Final    Comment: (NOTE) SARS-CoV-2 target nucleic acids are NOT DETECTED.  The SARS-CoV-2 RNA is generally detectable in upper and lower respiratory  specimens during the acute phase of infection. The lowest concentration of SARS-CoV-2 viral copies this assay can detect is 250 copies / mL. A negative result does not preclude SARS-CoV-2 infection and should not be used as the sole basis for treatment or other patient management decisions.  A negative result may occur with improper specimen collection / handling, submission of specimen other than nasopharyngeal swab, presence of viral mutation(s) within the areas targeted by this assay, and inadequate number of viral copies (<250 copies / mL). A negative result must be combined with clinical observations, patient history, and epidemiological information.  Fact Sheet for Patients:   RoadLapTop.co.za  Fact Sheet for Healthcare  Providers: http://kim-miller.com/  This test is not yet approved or  cleared by the [place] FDA and has been authorized for detection and/or diagnosis of SARS-CoV-2 by FDA under an Emergency Use Authorization (EUA).  This EUA will remain in effect (meaning this test can be used) for the duration of the COVID-19 declaration under Section 564(b)(1) of the Act, 21 U.S.C. section 360bbb-3(b)(1), unless the authorization is terminated or revoked sooner.  Performed at [facility], [address], [place], [place] [address]     Signed:  Carollee Herter DO.  [facility] 07/23/2022, 12:56 PM

## 2022-07-23 NOTE — Assessment & Plan Note (Signed)
Patient's caregiver [name] with whom the patient lives under a assisted family living agreement through [facility] states the patient has a history of attention seeking behavior.  Patient goes to adult daycare and mimics many of the other clients symptoms in order to gain attention.  Patient has recently started urinating on herself in order to gain attention.  Caregiver states the patient will often ask for surgery or other procedures in order to gain attention.

## 2022-07-24 ENCOUNTER — Inpatient Hospital Stay (HOSPITAL_COMMUNITY): Payer: Medicare Other

## 2022-07-24 DIAGNOSIS — R569 Unspecified convulsions: Secondary | ICD-10-CM | POA: Diagnosis not present

## 2022-07-24 DIAGNOSIS — G9340 Encephalopathy, unspecified: Secondary | ICD-10-CM | POA: Diagnosis not present

## 2022-07-24 DIAGNOSIS — F418 Other specified anxiety disorders: Secondary | ICD-10-CM

## 2022-07-24 DIAGNOSIS — Z765 Malingerer [conscious simulation]: Secondary | ICD-10-CM

## 2022-07-24 DIAGNOSIS — F79 Unspecified intellectual disabilities: Secondary | ICD-10-CM | POA: Diagnosis not present

## 2022-07-24 HISTORY — DX: Malingerer (conscious simulation): Z76.5

## 2022-07-24 LAB — BASIC METABOLIC PANEL
Anion gap: 11 (ref 5–15)
BUN: 6 mg/dL (ref 6–20)
CO2: 15 mmol/L — ABNORMAL LOW (ref 22–32)
Calcium: 8.6 mg/dL — ABNORMAL LOW (ref 8.9–10.3)
Chloride: 112 mmol/L — ABNORMAL HIGH (ref 98–111)
Creatinine, Ser: 0.58 mg/dL (ref 0.44–1.00)
GFR, Estimated: >60 mL/min (ref >60)
Glucose, Bld: 65 mg/dL — ABNORMAL LOW (ref 70–99)
Potassium: 3.8 mmol/L (ref 3.5–5.1)
Sodium: 138 mmol/L (ref 135–145)

## 2022-07-24 LAB — URINALYSIS, ROUTINE W REFLEX MICROSCOPIC
Bilirubin Urine: NEGATIVE
Glucose, UA: NEGATIVE mg/dL
Hgb urine dipstick: NEGATIVE
Ketones, ur: 80 mg/dL — AB
Leukocytes,Ua: NEGATIVE
Nitrite: NEGATIVE
Protein, ur: NEGATIVE mg/dL
Specific Gravity, Urine: 1.017 (ref 1.005–1.030)
pH: 5 (ref 5.0–8.0)

## 2022-07-24 LAB — RAPID URINE DRUG SCREEN, HOSP PERFORMED
Amphetamines: NOT DETECTED
Barbiturates: NOT DETECTED
Benzodiazepines: NOT DETECTED
Cocaine: NOT DETECTED
Opiates: NOT DETECTED
Tetrahydrocannabinol: NOT DETECTED

## 2022-07-24 LAB — MAGNESIUM: Magnesium: 1.7 mg/dL (ref 1.7–2.4)

## 2022-07-24 MED ORDER — LEVETIRACETAM 500 MG PO TABS
1500.0000 mg | ORAL_TABLET | Freq: Two times a day (BID) | ORAL | Status: DC
  Filled 2022-07-24: qty 3

## 2022-07-24 MED ORDER — LORAZEPAM 2 MG/ML IJ SOLN: 1.0000 mg | INTRAMUSCULAR | Status: DC | PRN

## 2022-07-24 MED ORDER — LACOSAMIDE 10 MG/ML PO SOLN
200.0000 mg | Freq: Two times a day (BID) | ORAL | Status: DC
  Administered 2022-07-24 – 2022-07-31 (×13): 200 mg
  Filled 2022-07-24 (×13): qty 20

## 2022-07-24 MED ORDER — LEVETIRACETAM 100 MG/ML PO SOLN
1500.0000 mg | Freq: Two times a day (BID) | ORAL | Status: DC
  Administered 2022-07-24 – 2022-07-31 (×15): 1500 mg
  Filled 2022-07-24 (×16): qty 15

## 2022-07-24 MED ORDER — LEVETIRACETAM IN NACL 1500 MG/100ML IV SOLN
1500.0000 mg | Freq: Two times a day (BID) | INTRAVENOUS | Status: DC
  Administered 2022-07-24: 1500 mg via INTRAVENOUS
  Filled 2022-07-24 (×2): qty 100

## 2022-07-24 NOTE — Assessment & Plan Note (Addendum)
Pt with noted spontaneous voluntary movements of her arms/hand/legs/feet. Resistant to taking her meds. Pt clearly tracking with her eyes and moving her head. There is secondary gains by patient in staying in the hospital consistent with caregiver's impression of attention-seeking behavior. Pt confronted this AM by this Clinical research associate with unit charge RN present. Pt may aware that I will not tolerate her "playing games" anymore.  Warned her that if she continues to refuse to take her meds, ambulate and comply with her care, that her caregiver [name] will no longer be able to care for her in the caregiver's home and pt will be placed into another facility.

## 2022-07-24 NOTE — Consult Note (Signed)
Brief Psychiatry Consult Note  Kristi King is a 42 yo female who was admitted for altered mental status. Medical history is significant for seizure disorder, hypertension, bipolar I disorder, and intellectual disability . The patient was last seen by the psychiatry service on 07/22/2022. Patient was initially evaluated for concerns of catatonia characterized by mutism, posturing, negativism, and immobility. She received lorazepam 1 mg IM/PO TID for 48 hours. Per primary's note, there was also concern that patient's behavior was related to secondary gain, has a documented history of attention seeking behavior in her ALF, has requested surgeries and procedures while under the care of her caregiver.   Interim documentation by primary team and nursing staff has been reviewed. Received secure chat by Dr. Imogene Burn and staff yesterday evening, after patient was found with unmarked syringe with needle at bedside. At that time continuous observation was recommended (either 1:1 sitter or telesitter), and removal of personal belongings  including electronic and cellular devices. UDS was negative.   Patient was reassessed this morning with no evidence of catatonia on physical exam. Patient's movement appeared spontaneous, with no evidence of rigidity, posturing, echopraxia, waxy flexibility, mutism, negativism, or stereotypy as per the Bush-Francis Catatonia Rating Scale. Patient did not engage in conversation or voluntary speech but responded "no" when directly questioned about drug use during her hospital stay. The patient is refusing to eat and participate in care, including taking prescribed medications. Based on clinical evaluation and observed behavior, this refusal appears to be volitional and consistent with malingering. All appropriate interventions and counseling have been offered and documented.  Patient continues to remain stable and continue to recommend discharge at this time.   We will sign off at this  time. This has been communicated to the primary team. If issues arise in the future, don't hesitate to reconsult the Psychiatry Inpatient Consult Service.   Lorri Frederick, MD

## 2022-07-24 NOTE — Plan of Care (Signed)
   Problem: Education: Goal: Knowledge of General Education information will improve Description: Including pain rating scale, medication(s)/side effects and non-pharmacologic comfort measures Outcome: Not Progressing   Problem: Health Behavior/Discharge Planning: Goal: Ability to manage health-related needs will improve Outcome: Not Progressing   Problem: Clinical Measurements: Goal: Ability to maintain clinical measurements within normal limits will improve Outcome: Not Progressing Goal: Will remain free from infection Outcome: Not Progressing Goal: Diagnostic test results will improve Outcome: Not Progressing Goal: Respiratory complications will improve Outcome: Not Progressing Goal: Cardiovascular complication will be avoided Outcome: Not Progressing   Problem: Activity: Goal: Risk for activity intolerance will decrease Outcome: Not Progressing   Problem: Nutrition: Goal: Adequate nutrition will be maintained Outcome: Not Progressing   Problem: Coping: Goal: Level of anxiety will decrease Outcome: Not Progressing   Problem: Elimination: Goal: Will not experience complications related to bowel motility Outcome: Not Progressing Goal: Will not experience complications related to urinary retention Outcome: Not Progressing   Problem: Safety: Goal: Ability to remain free from injury will improve Outcome: Not Progressing   Problem: Skin Integrity: Goal: Risk for impaired skin integrity will decrease Outcome: Not Progressing

## 2022-07-24 NOTE — Progress Notes (Signed)
PROGRESS NOTE    Kristi King  ZOX:[id number] DOB: Dec 14, 1980 DOA: 07/21/2022 PCP: Leilani Able, MD  Subjective: DC canceled yesterday when pt refused to stand, walk or assist in getting dressed.  Caregiver [name] at bedside yesterday and reported that pt is not welcome back into caregiver's home if pt is unable to stand and ambulate. Pt with hx of behavioral issues including attention seeking and being argumentative.  I have confronted patient this AM with the unit charge RN as witness. Pt was alert and appeared to listen to this interaction.   Hospital Course: HPI: 42 year old female with a past medical history of seizure disorder, hypertension, bipolar disorder, intellectual disability who presents to the emergency department accompanied by her caregiver Kristi King ([phone]), Kristi King provides history at the bedside that the patient had a seizure yesterday at approximately noon for 3 minutes time and then subsequently had a postictal state for approximately half an hour however when the patient woke up this morning covered in urine the patient has been unresponsive to tactile or verbal stimuli and at baseline patient walks up and down stairs talks and showers.  Prior to this the caregiver does note increased behavioral symptoms namely the patient jumped out of a moving car with a previous caregiver.     Of note the patient has not had her Vimpat for the last 2 days as per the caregiver stating that the pharmacy had run out of the medication.  Case discussed with the ED physician and neurology has already been consulted and plans for overnight EEG although initial impression was behavioral rather than nonconvulsive status etc.   In the emergency department the patient received lacosamide 200 mg IV as well as Keppra 1500 mg IV.  As per the caregiver the patient has consistently been full code and voiced this on multiple occasions in the past as well.  Significant Events: Pt  admitted 07-21-2022 Pt prepped for discharge 07-23-2022. Pt noted to have spontaneous voluntary movements and would intermittently answer questions.  Uncapped, unmarked syringe with needle found in her bed. UDS negative. Pt refused to walk or take oral meds. Discharge canceled.  Significant Labs: Normal ammonia Normal CK  Significant Imaging Studies: CT  head negative for acute abnormality  Antibiotic Therapy:   Procedures: EEG that showed moderate diffuse encephalopathy, nonspecific etiology. No seizures or epileptiform discharges were seen throughout the recording.   Consultants: Neurology Psych     Assessment and Plan: * Seizure [facility]) Seizure disorder, poorly controlled. Likely due to recent lapse in delivery of her chronic AEDs. Pt's caregiver states pt has adequate supply of all 3 of her AEDs now. CT head was unremarkable, had seizure in the setting of not receiving the home Vimpat Admitting hospitalist discussed with neurologist and does not think nonconvulsive status rather thinks behavioral, to continue home meds via IV at one-to-one dosing. EEG negative for seizure.  Continue with oral keppra 1500 mg bid and oral vimpat 200 mg bid.  Malingering Pt with noted spontaneous voluntary movements of her arms/hand/legs/feet. Resistant to taking her meds. Pt clearly tracking with her eyes and moving her head. There is secondary gains by patient in staying in the hospital consistent with caregiver's impression of attention-seeking behavior. Pt confronted this AM by this Clinical research associate with unit charge RN present. Pt may aware that I will not tolerate her "playing games" anymore.  Warned her that if she continues to refuse to take her meds, ambulate and comply with her care, that her caregiver [name]  will no longer be able to care for her in the caregiver's home and pt will be placed into another facility.  Intellectual disability Has caregiver but functional at baseline. Talks a lot  per caregiver and is ambulatory. Pt walks up and down stairs per her caregiver [name].  Pt lives with caregiver in the caregiver's home. Pt is part of a Assisted Family Living program by [facility] [place], [place].  Bipolar disorder [facility]) Psych consulted. They will follow. Pt found with uncapped needle and syringe in her bed yesterday. RN verified with pharmacy that no such syringe with needle is dispensed anywhere in hospital.  Given that I have verified that pt has hx of attention seeking behavior and this "catatonic" state has happened before with patient, I feel confident that pt's current behavior is due to secondary gain and not from seizures/post-ictal state. Pt is medically stable from my standpoint.  Hypercholesterolemia Hypercholesterolemia. Normal CK. On gemfibrozil.  Metabolic acidosis Metabolic acidosis. Stable. I wonder if her seizure meds are doing this. oral bicarb 650 mg tid added. Serum bicarb improving.  Essential hypertension Pt able to take meals. Continue to hold HTN meds. Pt is not hypertensive off meds.  Acute encephalopathy I think encephalopathy may be from recent seizure vs non-organic manifestation of behavior for secondary gain. Initial Psych consult saw patient and thought she had catatonia.  She was not catatonic the last 3 days. Pt exhibits voluntary movements but seems to catch herself moving her arms/legs and then suddenly stops moving them.  Psych has started IM/PO Ativan TID but this was stopped yesterday. Pt was tracking me with her eyes upon entering her room today on 2W. Pt noted to have voluntary movement of her head/arms/hands.  Anxiety disorder with care-seeking behavior Patient's caregiver [name] with whom the patient lives under a assisted family living agreement through [facility] [place] states the patient has a history of attention seeking behavior.  Patient goes to adult daycare and mimics many  of the other clients symptoms in order to gain attention.  Patient has recently started urinating on herself in order to gain attention.  Caregiver states the patient will often ask for surgery or other procedures in order to gain attention.  Hypokalemia Resolved. With addition of PO Bicarb, will add po kcl.  Body mass index (BMI) 40.0-44.9, adult (HCC) Chronic.   DVT prophylaxis: Lovenox   Code Status: Full Code Family Communication: no family at bedside Disposition Plan: return home vs SNF placement Reason for continuing need for hospitalization: medically stable for discharge.  Objective: Vitals:   07/23/22 0751 07/24/22 0037 07/24/22 0459 07/24/22 0500  BP: 119/65  118/71   Pulse: 91 76 82   Resp: 18  18   Temp: 98.5 F (36.9 C)  99.5 F (37.5 C)   TempSrc:   Oral   SpO2: 96% 96% 97%   Weight:    85.3 kg  Height:        Intake/Output Summary (Last 24 hours) at 07/24/2022 0833 Last data filed at 07/24/2022 0501 Gross per 24 hour  Intake 706.09 ml  Output 250 ml  Net 456.09 ml   Filed Weights   07/21/22 1525 07/24/22 0500  Weight: 98.9 kg 85.3 kg    Examination:  Physical Exam Vitals and nursing note reviewed.  Constitutional:      Comments: Tracks Conservation officer, nature with her eyes the moment I enter her room  HENT:     Head: Normocephalic and atraumatic.  Nose: Nose normal.  Pulmonary:     Effort: Pulmonary effort is normal. No respiratory distress.  Abdominal:     General: There is no distension.     Tenderness: There is no abdominal tenderness.  Neurological:     Mental Status: She is alert.  Psychiatric:     Comments: Pt attentive and makes eye contact during verbal interaction. Noted to have spontaneous movement of her arms/hand and head during interaction.     Data Reviewed: I have personally reviewed following labs and imaging studies  CBC: Recent Labs  Lab 07/21/22 1707 07/21/22 2352 07/22/22 0433  WBC 11.4* 8.3 7.5  NEUTROABS 8.3*  --   --    HGB 13.9 13.1 13.1  HCT 43.3 40.5 40.4  MCV 92.3 93.1 91.8  PLT 254 237 233   Basic Metabolic Panel: Recent Labs  Lab 07/21/22 1707 07/21/22 2352 07/22/22 0433 07/23/22 0801 07/24/22 0540  NA 142  --  139 137 138  K 3.9  --  3.1* 3.5 3.8  CL 109  --  113* 111 112*  CO2 17*  --  18* 13* 15*  GLUCOSE 73  --  81 58* 65*  BUN 10  --  8 7 6   CREATININE 0.65 0.69 0.68 0.67 0.58  CALCIUM 9.0  --  8.7* 8.5* 8.6*  MG  --   --   --  1.7 1.7   GFR: Estimated Creatinine Clearance: 92.8 mL/min (by C-G formula based on SCr of 0.58 mg/dL). Liver Function Tests: Recent Labs  Lab 07/21/22 1707 07/22/22 0433 07/23/22 0801  AST 15 16 15   ALT 10 16 15   ALKPHOS 48 44 45  BILITOT 0.2* 0.6 0.8  PROT 6.2* 5.9* 5.9*  ALBUMIN 3.5 3.1* 3.0*    Recent Labs  Lab 07/22/22 0437  AMMONIA 15   Coagulation Profile: Recent Labs  Lab 07/22/22 0437  INR 1.1   Cardiac Enzymes: Recent Labs  Lab 07/21/22 1707  CKTOTAL 151   CBG: Recent Labs  Lab 07/21/22 2218  GLUCAP 74   Thyroid Function Tests: Recent Labs    07/22/22 0433  TSH 0.622   Anemia Panel: Recent Labs    07/22/22 0433  VITAMINB12 156*   Sepsis Labs: Recent Labs  Lab 07/21/22 1707 07/22/22 0437  LATICACIDVEN 1.3 0.6    No results found for this or any previous visit (from the past 240 hour(s)).   Radiology Studies: No results found.   Scheduled Meds:  enoxaparin (LOVENOX) injection  40 mg Subcutaneous Daily   gemfibrozil  600 mg Oral BID AC   lacosamide  200 mg Oral BID   levETIRAcetam  1,500 mg Oral BID   mouth rinse  15 mL Mouth Rinse TID   potassium chloride  20 mEq Oral BID   sodium bicarbonate  650 mg Oral TID   zonisamide  100 mg Oral BID   Continuous Infusions:   LOS: 2 days   Time spent: 35 minutes  Carollee Herter, DO  [facility]  07/24/2022, 8:33 AM

## 2022-07-24 NOTE — Progress Notes (Signed)
Patient not verbally responding to questions and refusing to take PO scheduled medications. Provider notified and ordered IV keppra as well as IV ativan prn. Patient is not in any distress, VSS, and safety sitter is at the bedside.

## 2022-07-24 NOTE — Progress Notes (Signed)
   Psych note reviewed. Pt is malingering. Refusing to take oral meds when she is actually capable of it. Will place NG tube for her AEDs.  Carollee Herter, DO [facility]

## 2022-07-24 NOTE — Progress Notes (Addendum)
[facility] night cross cover note:   I was notified by RN that this patient is refusing her evening oral doses of Keppra 1500 mg p.o. twice daily as well as her evening oral Vimpat and zonisamide. she is amenable to IV route of administration for her Keppra.    I subsequently changed her existing oral Keppra order to the IV equivalent of Keppra 1500 mg IV twice daily, and added as needed IV Ativan for seizures.     Newton Pigg, DO Hospitalist

## 2022-07-24 NOTE — Progress Notes (Signed)
Good morning, I put in a nurse's note but I will also chart in the behavior section. She has refused to talk/answer questions; she refused labs this morning, and refused ambulation. She just looks at me with a flat affect. No distress noted. Safety sitter at the bedside.

## 2022-07-25 DIAGNOSIS — R569 Unspecified convulsions: Secondary | ICD-10-CM | POA: Diagnosis not present

## 2022-07-25 MED ORDER — LORAZEPAM 2 MG/ML IJ SOLN
0.5000 mg | Freq: Once | INTRAMUSCULAR | Status: AC | PRN
  Administered 2022-07-25: 0.5 mg via INTRAVENOUS
  Filled 2022-07-25: qty 1

## 2022-07-25 NOTE — Evaluation (Signed)
Physical Therapy Evaluation Patient Details Name: Kristi King MRN: [id number] DOB: 1980/03/20 Today's Date: 07/25/2022  History of Present Illness  42 y.o. female presents to [facility] hospital on 07/21/2022 after seizure the day prior. Pt in a catatonic state on 7/14. Pt suspected of malingering during admission. PMH includes seizure, hypotension, bipolar disorder, intellectual disability  Clinical Impression  Pt presents to PT with deficits in functional mobility, gait, balance, cognition. Pt is very poor to initiate, inconsistently following commands during session and with limited verbal communication. Pt requires significant assistance to initiate bed mobility and initial transfer as well as gait. Once mobilizing pt is able to continue ambulating with less significant physical assistance, however is at a high risk for falls due to posterior lean. Pt does not communicate any needs during session, does smile and laugh at PT comments on her rainbow nail color. Due to poor initiation and sequencing of mobility PT recommends short term inpatient PT placement at this time.        Assistance Recommended at Discharge Frequent or constant Supervision/Assistance  If plan is discharge home, recommend the following:  Can travel by private vehicle  A lot of help with walking and/or transfers;A lot of help with bathing/dressing/bathroom;Assistance with cooking/housework;Direct supervision/assist for medications management;Assistance with feeding;Assist for transportation;Direct supervision/assist for financial management;Help with stairs or ramp for entrance   No    Equipment Recommendations Wheelchair (measurements PT);Wheelchair cushion (measurements PT) (may progress to alternative DME)  Recommendations for Other Services       Functional Status Assessment Patient has had a recent decline in their functional status and demonstrates the ability to make significant improvements in function in a  reasonable and predictable amount of time.     Precautions / Restrictions Precautions Precautions: Fall Restrictions Weight Bearing Restrictions: No      Mobility  Bed Mobility Overal bed mobility: Needs Assistance Bed Mobility: Supine to Sit, Sit to Supine     Supine to sit: Max assist, HOB elevated Sit to supine: Max assist        Transfers Overall transfer level: Needs assistance Equipment used: 1 person hand held assist Transfers: Sit to/from Stand Sit to Stand: Max assist, Min assist           General transfer comment: maxA for initial transfer, minA for transfer after ambulation    Ambulation/Gait Ambulation/Gait assistance: Max assist, Mod assist Gait Distance (Feet): 20 Feet Assistive device: 1 person hand held assist Gait Pattern/deviations: Leaning posteriorly Gait velocity: reduced Gait velocity interpretation: <1.31 ft/sec, indicative of household ambulator   General Gait Details: pt with very poor initiation, PT provides significant lateral weight shift with opposite pelvic rotation anteriorly to initiate stepping. After initiation of 2 steps pt begins to seqeunce stepping without physical assist at trunk. With stoppages of gait pt does lean posteriorly onto PT, requiring assist to maintain balance  Stairs            Wheelchair Mobility     Tilt Bed    Modified Rankin (Stroke Patients Only)       Balance Overall balance assessment: Needs assistance Sitting-balance support: No upper extremity supported, Feet supported Sitting balance-Leahy Scale: Good     Standing balance support: Bilateral upper extremity supported Standing balance-Leahy Scale: Poor Standing balance comment: modA, posterior lean                             Pertinent Vitals/Pain Pain Assessment Pain Assessment: PAINAD  Breathing: normal Negative Vocalization: none Facial Expression: smiling or inexpressive Body Language: relaxed Consolability: no  need to console PAINAD Score: 0 Pain Intervention(s): Monitored during session    Home Living Family/patient expects to be discharged to:: Other (Comment)                   Additional Comments: Assisted Family Living, unsure of home setup for this patient but notes indicate she typically manages stairs    Prior Function Prior Level of Function : Needs assist             Mobility Comments: notes appears to indicate the pt is able to ambulate without DME and negotiates stairs ADLs Comments: no caregiver present, pt not communicating verbally     Hand Dominance        Extremity/Trunk Assessment   Upper Extremity Assessment Upper Extremity Assessment: Difficult to assess due to impaired cognition    Lower Extremity Assessment Lower Extremity Assessment: Difficult to assess due to impaired cognition (pt does bear body weight during ambulation without buckling)    Cervical / Trunk Assessment Cervical / Trunk Assessment: Normal  Communication   Communication: Expressive difficulties;Receptive difficulties  Cognition Arousal/Alertness: Awake/alert Behavior During Therapy: Flat affect Overall Cognitive Status: History of cognitive impairments - at baseline                                 General Comments: pt with developmental delay but typically able to verbally communicate per chart review. Pt inconsistently follows commands at this time. PT unable to fully assess cognition due to communication impairments        General Comments General comments (skin integrity, edema, etc.): VSS on RA    Exercises     Assessment/Plan    PT Assessment Patient needs continued PT services  PT Problem List Decreased strength;Decreased activity tolerance;Decreased balance;Decreased mobility;Decreased coordination;Decreased cognition;Decreased knowledge of use of DME;Decreased safety awareness;Decreased knowledge of precautions       PT Treatment Interventions  DME instruction;Gait training;Stair training;Functional mobility training;Therapeutic activities;Balance training;Therapeutic exercise;Neuromuscular re-education;Patient/family education    PT Goals (Current goals can be found in the Care Plan section)  Acute Rehab PT Goals Patient Stated Goal: pt does not state, caregiver goal for pt to ambulate based on chart PT Goal Formulation: Patient unable to participate in goal setting Time For Goal Achievement: 08/08/22 Potential to Achieve Goals: Fair    Frequency Min 3X/week     Co-evaluation               AM-PAC PT "6 Clicks" Mobility  Outcome Measure Help needed turning from your back to your side while in a flat bed without using bedrails?: A Lot Help needed moving from lying on your back to sitting on the side of a flat bed without using bedrails?: A Lot Help needed moving to and from a bed to a chair (including a wheelchair)?: A Lot Help needed standing up from a chair using your arms (e.g., wheelchair or bedside chair)?: A Lot Help needed to walk in hospital room?: A Lot Help needed climbing 3-5 steps with a railing? : Total 6 Click Score: 11    End of Session Equipment Utilized During Treatment: Gait belt Activity Tolerance: Patient tolerated treatment well Patient left: in bed;with call bell/phone within reach;with bed alarm set;with nursing/sitter in room Nurse Communication: Mobility status PT Visit Diagnosis: Other abnormalities of gait and mobility (R26.89);Muscle weakness (generalized) (M62.81);Other  symptoms and signs involving the nervous system (R29.898)    Time: 1610-9604 PT Time Calculation (min) (ACUTE ONLY): 17 min   Charges:   PT Evaluation $PT Eval Low Complexity: 1 Low   PT General Charges $$ ACUTE PT VISIT: 1 Visit         Arlyss Gandy, PT, DPT Acute Rehabilitation Office [phone]   Arlyss Gandy 07/25/2022, 2:14 PM

## 2022-07-25 NOTE — Progress Notes (Signed)
[facility] PROGRESS NOTE  Kristi King (DOB: May 16, 1980) Kristi King:[id number] PCP: Leilani Able, MD  Brief Narrative: Hospital Course: HPI: 42 year old female with a past medical history of seizure disorder, hypertension, bipolar disorder, intellectual disability who presents to the emergency department accompanied by her caregiver [name] ([phone]), [name] provides history at the bedside that the patient had a seizure yesterday at approximately noon for 3 minutes time and then subsequently had a postictal state for approximately half an hour however when the patient woke up this morning covered in urine the patient has been unresponsive to tactile or verbal stimuli and at baseline patient walks up and down stairs talks and showers.  Prior to this the caregiver does note increased behavioral symptoms namely the patient jumped out of a moving car with a previous caregiver.     Of note the patient has not had her Vimpat for the last 2 days as per the caregiver stating that the pharmacy had run out of the medication.  Case discussed with the ED physician and neurology has already been consulted and plans for overnight EEG although initial impression was behavioral rather than nonconvulsive status etc.   In the emergency department the patient received lacosamide 200 mg IV as well as Keppra 1500 mg IV.  As per the caregiver the patient has consistently been full code and voiced this on multiple occasions in the past as well.   Significant Events: Pt admitted 07-21-2022 Pt prepped for discharge 07-23-2022. Pt noted to have spontaneous voluntary movements and would intermittently answer questions.  Uncapped, unmarked syringe with needle found in her bed. UDS negative. Pt refused to walk or take oral meds. Discharge canceled. 7/16. Continued to decline enteral nutrition/medications. High suspicion for malingering per psychiatry.  PT/OT evaluations to inform venue of disposition as group home states  they will ne accept her back.  Subjective: Pt nonverbal, has been moving eyes and tracking until I introduce myself. Has not taken po per sitter despite encouragement.   Objective: BP (!) 136/92 (BP Location: Left Arm)   Pulse 91   Temp 98.1 F (36.7 C) (Axillary)   Resp 20   Ht 5\' 2"  (1.575 m)   Wt 62.1 kg   SpO2 97%   BMI 25.02 kg/m   Gen: No distress Pulm: Clear, nonlabored  CV: RRR, no edema GI: Soft, NT, ND, +BS Neuro: Alert, not cooperative with exam.  Ext: Warm, no deformities Skin: No acute-appearing rashes, lesions or ulcers on visualized skin   Assessment & Plan: Seizure disorder, poorly controlled. Likely due to recent lapse in delivery of her chronic AEDs. CT head was unremarkable - Pt's caregiver states pt has adequate supply of all 3 of her AEDs now. Continue keppra, vimpat, zonisamide through NG.  - Admitting hospitalist discussed with neurologist and does not think nonconvulsive status rather thinks behavioral, EEG negative for seizure.    Malingering: Pt with noted spontaneous voluntary movements of her arms/hand/legs/feet. Resistant to taking her meds. Pt clearly tracking with her eyes and moving her head. There is secondary gains by patient in staying in the hospital consistent with caregiver's impression of attention-seeking behavior.  - Pt made aware that if she continues to refuse to take her meds, ambulate and comply with her care, that her caregiver [name] will no longer be able to care for her in the caregiver's home and pt will be placed into another facility. PT/OT consulted. - There are no active medical issues to necessitate inpatient management. Once safe disposition venue  is secured, patient can be discharged.   Intellectual disability Has caregiver but functional at baseline. Talks a lot per caregiver and is ambulatory. Pt walks up and down stairs per her caregiver [name].  Pt lives with caregiver in the caregiver's home. Pt is  part of a Assisted Family Living program by [facility] [place], [place].   Bipolar disorder [facility]) Psych consulted. They will follow. Pt found with uncapped needle and syringe in her bed 7/15. RN verified with pharmacy that no such syringe with needle is dispensed anywhere in hospital.  Given that I have verified that pt has hx of attention seeking behavior and this "catatonic" state has happened before with patient, I feel confident that pt's current behavior is due to secondary gain and not from seizures/post-ictal state.    Hypercholesterolemia Hypercholesterolemia. Normal CK. On gemfibrozil.   Metabolic acidosis Metabolic acidosis. Stable. I wonder if her seizure meds are doing this.  - Oral bicarb 650 mg tid added. Serum bicarb improving.   Essential hypertension - Continue to hold HTN meds. Pt is not hypertensive off meds.   Acute encephalopathy I think encephalopathy may be from recent seizure vs non-organic manifestation of behavior for secondary gain. Initial Psych consult saw patient and thought she had catatonia.  She was not catatonic the last 3 days. Pt exhibits voluntary movements but seems to catch herself moving her arms/legs and then suddenly stops moving them.  Psych has started IM/PO Ativan TID but this was stopped yesterday. Pt was tracking me with her eyes upon entering her room today on 2W. Pt noted to have voluntary movement of her head/arms/hands.   Anxiety disorder with care-seeking behavior Patient's caregiver [name] with whom the patient lives under a assisted family living agreement through [facility] [place] states the patient has a history of attention seeking behavior.  Patient goes to adult daycare and mimics many of the other clients symptoms in order to gain attention.  Patient has recently started urinating on herself in order to gain attention.  Caregiver states the patient will often ask for surgery or other procedures  in order to gain attention.   Hypokalemia Resolved. With addition of PO Bicarb, will add po kcl.  [name], MD [facility] [email or web address] 07/25/2022, 12:08 PM

## 2022-07-25 NOTE — TOC Progression Note (Signed)
Transition of Care [facility]) - Progression Note    Patient Details  Name: Kristi King MRN: [id number] Date of Birth: March 22, 1980  Transition of Care [facility]) CM/SW Contact  Janae Bridgeman, RN Phone Number: 07/25/2022, 4:40 PM  Clinical Narrative:    CM noted that PT/OT evaluations have been completed.  I completed the FL@ and requested MD to sign.  Patient was faxed out in the area for SNF offers.  Patient current care provider is unable to provide patient to return home until after rehabilitation.  Patient was faxed out In the hub for bed offers.  [name], MSW is aware and will follow up regarding bed offers for patient.  Patient's NGT will need to be discontinued prior to SNF admission.        Expected Discharge Plan and Services         Expected Discharge Date: 07/23/22                                     Social Determinants of Health (SDOH) Interventions SDOH Screenings   Food Insecurity: No Food Insecurity (07/24/2022)  Housing: Low Risk  (07/24/2022)  Transportation Needs: No Transportation Needs (07/24/2022)  Utilities: Not At Risk (07/24/2022)  Social Connections: Unknown (05/21/2022)   Received from [facility]  Tobacco Use: Low Risk  (07/21/2022)    Readmission Risk Interventions    07/23/2022   12:32 PM  Readmission Risk Prevention Plan  Post Dischage Appt Complete  Medication Screening Complete  Transportation Screening Complete

## 2022-07-25 NOTE — NC FL2 (Signed)
[place] MEDICAID FL2 LEVEL OF CARE FORM     IDENTIFICATION  Patient Name: Kristi King Birthdate: November 12, 1980 Sex: female Admission Date (Current Location): 07/21/2022  [facility] and [place] Number:  Producer, television/film/video and Address:  [facility] [place], [address], [place], [place] [address]      Provider Number: [id number]  Attending Physician Name and Address:  [name], MD  Relative Name and Phone Number:  [name] (Other)  [phone]    Current Level of Care: Hospital Recommended Level of Care: Skilled Nursing Facility Prior Approval Number:    Date Approved/Denied:   PASRR Number: still pending  Discharge Plan: SNF    Current Diagnoses: Patient Active Problem List   Diagnosis Date Noted   Malingering 07/24/2022   Anxiety disorder with care-seeking behavior 07/23/2022   Hypercholesterolemia 07/22/2022   Bipolar disorder (HCC) 07/22/2022   Intellectual disability 07/22/2022   Hypokalemia 07/22/2022   Acute encephalopathy 07/21/2022   Essential hypertension 07/21/2022   Metabolic acidosis 07/21/2022   Body mass index (BMI) 40.0-44.9, adult (HCC) 07/02/2021   Seizure (HCC) 09/19/2016    Orientation RESPIRATION BLADDER Height & Weight     Self  Normal Continent Weight: 62.1 kg Height:  5\' 2"  (157.5 cm)  BEHAVIORAL SYMPTOMS/MOOD NEUROLOGICAL BOWEL NUTRITION STATUS  Other (Comment) (malingering) Convulsions/Seizures Continent Diet (see discharge summary)  AMBULATORY STATUS COMMUNICATION OF NEEDS Skin   Extensive Assist Verbally (Pt has selective mutism, delayed speech responses) Normal                       Personal Care Assistance Level of Assistance  Bathing, Feeding, Dressing Bathing Assistance: Maximum assistance Feeding assistance: Maximum assistance Dressing Assistance: Maximum assistance     Functional Limitations Info  Sight, Hearing, Speech Sight Info: Impaired Hearing Info: Adequate Speech Info:  Adequate (selective mutism)    SPECIAL CARE FACTORS FREQUENCY  PT (By licensed PT), OT (By licensed OT)     PT Frequency: 5x/week OT Frequency: 5x/week            Contractures Contractures Info: Not present    Additional Factors Info  Code Status, Allergies, Psychotropic Code Status Info: FULL Allergies Info: Dilantin (Phenytoin)  Tegretol (Carbamazepine)           Current Medications (07/25/2022):  This is the current hospital active medication list Current Facility-Administered Medications  Medication Dose Route Frequency Provider Last Rate Last Admin   enoxaparin (LOVENOX) injection 40 mg  40 mg Subcutaneous Daily Core, Doy Hutching, MD   40 mg at 07/25/22 1111   gemfibrozil (LOPID) tablet 600 mg  600 mg Oral BID AC Carollee Herter, DO   600 mg at 07/25/22 1110   lacosamide (VIMPAT) oral solution 200 mg  200 mg Per Tube BID Carollee Herter, DO   200 mg at 07/25/22 1111   levETIRAcetam (KEPPRA) 100 MG/ML solution 1,500 mg  1,500 mg Per Tube BID Carollee Herter, DO   1,500 mg at 07/25/22 1112   Oral care mouth rinse  15 mL Mouth Rinse PRN Core, Doy Hutching, MD       Oral care mouth rinse  15 mL Mouth Rinse TID Carollee Herter, DO   15 mL at 07/24/22 1600   potassium chloride SA (KLOR-CON M) CR tablet 20 mEq  20 mEq Oral BID Carollee Herter, DO   20 mEq at 07/25/22 1110   sodium bicarbonate tablet 650 mg  650 mg Oral TID Carollee Herter, DO  650 mg at 07/25/22 1110   zonisamide (ZONEGRAN) capsule 100 mg  100 mg Oral BID Carollee Herter, DO   100 mg at 07/25/22 1112     Discharge Medications: Please see discharge summary for a list of discharge medications.  Relevant Imaging Results:  Relevant Lab Results:   Additional Information JXB:[id number]  Janae Bridgeman, RN

## 2022-07-25 NOTE — Plan of Care (Signed)
  Problem: Education: Goal: Knowledge of General Education information will improve Description: Including pain rating scale, medication(s)/side effects and non-pharmacologic comfort measures Outcome: Progressing   Problem: Clinical Measurements: Goal: Ability to maintain clinical measurements within normal limits will improve Outcome: Progressing Goal: Will remain free from infection Outcome: Progressing Goal: Diagnostic test results will improve Outcome: Progressing Goal: Respiratory complications will improve Outcome: Progressing Goal: Cardiovascular complication will be avoided Outcome: Progressing   Problem: Nutrition: Goal: Adequate nutrition will be maintained Outcome: Progressing   Problem: Coping: Goal: Level of anxiety will decrease Outcome: Progressing   Problem: Elimination: Goal: Will not experience complications related to bowel motility Outcome: Progressing Goal: Will not experience complications related to urinary retention Outcome: Progressing   Problem: Pain Managment: Goal: General experience of comfort will improve Outcome: Progressing   Problem: Safety: Goal: Ability to remain free from injury will improve Outcome: Progressing   Problem: Skin Integrity: Goal: Risk for impaired skin integrity will decrease Outcome: Progressing   Problem: Safety: Goal: Non-violent Restraint(s) Outcome: Progressing

## 2022-07-25 NOTE — Evaluation (Signed)
Occupational Therapy Evaluation Patient Details Name: Kristi King MRN: [id number] DOB: 1980-02-21 Today's Date: 07/25/2022   History of Present Illness 42 y.o. female presents to [facility] hospital on 07/21/2022 after seizure the day prior. Pt in a catatonic state on 7/14. Pt suspected of malingering during admission. PMH includes seizure, hypotension, bipolar disorder, intellectual disability   Clinical Impression   Pt evaluated s/p above admission list. Pt with no verbalizations to gather PLOF this session and no caregiver present. Per chart review, pt was living with caregiver at family assisted living. Pt presents this session with generalized weakness, decreased balance, decreased cognition with pt following <5% of simple 1 step commands with max multimodal cues. Pt currently requires max-total A for seated UB ADLs and total A for LB ADLs. Pt required varying levels of mod-min guard A for sitting balance secondary to posterior bias. Pt completed bed mobility and STS transfer from EOB with max A +2. Pt able to tolerate standing for approx 15 seconds with min A +2. Pt would benefit from continued acute OT services to maximize functional independence and facilitate transition to skilled inpatient follow up therapy, <3 hours/day.      Recommendations for follow up therapy are one component of a multi-disciplinary discharge planning process, led by the attending physician.  Recommendations may be updated based on patient status, additional functional criteria and insurance authorization.   Assistance Recommended at Discharge Frequent or constant Supervision/Assistance  Patient can return home with the following Two people to help with walking and/or transfers;Two people to help with bathing/dressing/bathroom;Assistance with cooking/housework;Direct supervision/assist for medications management;Direct supervision/assist for financial management;Assistance with feeding;Help with stairs or ramp for  entrance;Assist for transportation    Functional Status Assessment  Patient has had a recent decline in their functional status and demonstrates the ability to make significant improvements in function in a reasonable and predictable amount of time.  Equipment Recommendations  Other (comment) (defer)    Recommendations for Other Services       Precautions / Restrictions Precautions Precautions: Fall Restrictions Weight Bearing Restrictions: No      Mobility Bed Mobility Overal bed mobility: Needs Assistance Bed Mobility: Supine to Sit, Sit to Supine     Supine to sit: Max assist, +2 for physical assistance, +2 for safety/equipment, HOB elevated Sit to supine: Max assist, +2 for physical assistance, +2 for safety/equipment, HOB elevated   General bed mobility comments: assist with trunk and BLEs    Transfers Overall transfer level: Needs assistance Equipment used: 2 person hand held assist Transfers: Sit to/from Stand Sit to Stand: Max assist, +2 physical assistance, +2 safety/equipment           General transfer comment: STS transfer from EOB with max A+2 and max multimodal cues for initiation. Standing balance min A +2.      Balance Overall balance assessment: Needs assistance Sitting-balance support: Bilateral upper extremity supported, Feet supported Sitting balance-Leahy Scale: Poor Sitting balance - Comments: sitting EOB with varying levels of mod to min guard A 2/2 posterior LOB. Postural control: Posterior lean Standing balance support: Bilateral upper extremity supported Standing balance-Leahy Scale: Poor Standing balance comment: BUE support with posterior bias                           ADL either performed or assessed with clinical judgement   ADL Overall ADL's : Needs assistance/impaired Eating/Feeding: Total assistance;Bed level   Grooming: Wash/dry face;Total assistance;Sitting;Brushing hair Grooming Details (indicate cue  type and  reason): total A to comb hair at EOB, max A to wash face at EOB with hand over hand and max multimodal cues Upper Body Bathing: Maximal assistance;Sitting   Lower Body Bathing: Total assistance;+2 for physical assistance;+2 for safety/equipment;Sit to/from stand   Upper Body Dressing : Maximal assistance;Sitting   Lower Body Dressing: Total assistance;+2 for physical assistance;+2 for safety/equipment;Sit to/from stand   Toilet Transfer: Maximal assistance;+2 for physical assistance;+2 for safety/equipment;BSC/3in1;Stand-pivot Toilet Transfer Details (indicate cue type and reason): simulated Toileting- Clothing Manipulation and Hygiene: Total assistance;+2 for physical assistance;+2 for safety/equipment;Sit to/from stand       Functional mobility during ADLs: Maximal assistance;+2 for physical assistance;+2 for safety/equipment General ADL Comments: limited secondary to cognition and decreased command following/attention     Vision Baseline Vision/History: 0 No visual deficits Ability to See in Adequate Light: 0 Adequate Vision Assessment?: No apparent visual deficits Additional Comments: Difficult to assess 2/2 cognition     Perception Perception Perception Tested?: No   Praxis Praxis Praxis tested?: Not tested    Pertinent Vitals/Pain Pain Assessment Pain Assessment: Faces Faces Pain Scale: No hurt Pain Intervention(s): Monitored during session     Hand Dominance     Extremity/Trunk Assessment Upper Extremity Assessment Upper Extremity Assessment: Difficult to assess due to impaired cognition   Lower Extremity Assessment Lower Extremity Assessment: Difficult to assess due to impaired cognition   Cervical / Trunk Assessment Cervical / Trunk Assessment: Normal   Communication Communication Communication: Receptive difficulties;Expressive difficulties   Cognition Arousal/Alertness: Awake/alert Behavior During Therapy: Flat affect Overall Cognitive Status: History  of cognitive impairments - at baseline                                 General Comments: Pt with intellectual disability at baseline. Non verbal throughout session and follows <5% of commands with max multimodal cues.     General Comments  VSS on RA, sitter at bedside    Exercises     Shoulder Instructions      Home Living Family/patient expects to be discharged to:: Other (Comment)                                 Additional Comments: Assisted Family Living, unsure of home setup for this patient but notes indicate she typically manages stairs and lives with a caregiver      Prior Functioning/Environment Prior Level of Function : Needs assist             Mobility Comments: notes appears to indicate the pt is able to ambulate without DME and negotiates stairs ADLs Comments: no caregiver present, pt not communicating verbally        OT Problem List: Decreased strength;Decreased range of motion;Decreased activity tolerance;Impaired balance (sitting and/or standing);Decreased cognition;Decreased safety awareness;Decreased knowledge of use of DME or AE;Decreased knowledge of precautions      OT Treatment/Interventions: Self-care/ADL training;Therapeutic exercise;Energy conservation;DME and/or AE instruction;Therapeutic activities;Cognitive remediation/compensation;Patient/family education;Balance training    OT Goals(Current goals can be found in the care plan section) Acute Rehab OT Goals Patient Stated Goal: no verbalizations this session OT Goal Formulation: With patient Time For Goal Achievement: 08/08/22 Potential to Achieve Goals: Fair ADL Goals Pt Will Perform Grooming: sitting;with min assist Pt Will Perform Upper Body Dressing: with min assist;sitting Pt Will Transfer to Toilet: stand pivot transfer;bedside commode;with min assist Additional ADL  Goal #1: Pt will complete 1 step ADL task with min cues  OT Frequency: Min 2X/week     Co-evaluation              AM-PAC OT "6 Clicks" Daily Activity     Outcome Measure Help from another person eating meals?: Total Help from another person taking care of personal grooming?: Total Help from another person toileting, which includes using toliet, bedpan, or urinal?: A Lot Help from another person bathing (including washing, rinsing, drying)?: A Lot Help from another person to put on and taking off regular upper body clothing?: A Lot Help from another person to put on and taking off regular lower body clothing?: Total 6 Click Score: 9   End of Session Equipment Utilized During Treatment: Gait belt Nurse Communication: Mobility status  Activity Tolerance: Other (comment) (limited secondary to cognition) Patient left: in bed;with call bell/phone within reach;with bed alarm set;with nursing/sitter in room  OT Visit Diagnosis: Unsteadiness on feet (R26.81);Muscle weakness (generalized) (M62.81);Other symptoms and signs involving cognitive function                Time: 1433-1446 OT Time Calculation (min): 13 min Charges:  OT General Charges $OT Visit: 1 Visit OT Evaluation $OT Eval Moderate Complexity: 1 Mod  Sherley Bounds, OTS Acute Rehabilitation Services Office [phone] Secure Chat Communication Preferred   Sherley Bounds 07/25/2022, 3:50 PM

## 2022-07-25 NOTE — Progress Notes (Signed)
[facility] night cross cover note:   I was notified by RN that the patient is dry heaving.  I subsequently placed an order for ativan 0.5 mg IV x 1 dose prn for anxiety/nausea.    Newton Pigg, DO Hospitalist

## 2022-07-25 NOTE — Care Management Important Message (Signed)
Important Message  Patient Details  Name: MYKELL GENAO MRN: [id number] Date of Birth: 1980/12/02   Medicare Important Message Given:  Yes     Newt Levingston Stefan Church 07/25/2022, 3:38 PM

## 2022-07-26 ENCOUNTER — Encounter (HOSPITAL_COMMUNITY): Payer: Self-pay | Admitting: Internal Medicine

## 2022-07-26 DIAGNOSIS — R569 Unspecified convulsions: Secondary | ICD-10-CM | POA: Diagnosis not present

## 2022-07-26 LAB — COMPREHENSIVE METABOLIC PANEL
ALT: 14 U/L (ref 0–44)
AST: 15 U/L (ref 15–41)
Albumin: 3.3 g/dL — ABNORMAL LOW (ref 3.5–5.0)
Alkaline Phosphatase: 56 U/L (ref 38–126)
Anion gap: 11 (ref 5–15)
BUN: 8 mg/dL (ref 6–20)
CO2: 20 mmol/L — ABNORMAL LOW (ref 22–32)
Calcium: 9.1 mg/dL (ref 8.9–10.3)
Chloride: 105 mmol/L (ref 98–111)
Creatinine, Ser: 0.55 mg/dL (ref 0.44–1.00)
GFR, Estimated: >60 mL/min (ref >60)
Glucose, Bld: 84 mg/dL (ref 70–99)
Potassium: 3.2 mmol/L — ABNORMAL LOW (ref 3.5–5.1)
Sodium: 136 mmol/L (ref 135–145)
Total Bilirubin: 0.6 mg/dL (ref 0.3–1.2)
Total Protein: 6.9 g/dL (ref 6.5–8.1)

## 2022-07-26 NOTE — Progress Notes (Signed)
[facility] PROGRESS NOTE  Kristi King (DOB: [birth year]) UEA:[id number] PCP: Leilani Able, MD  Brief Narrative: Hospital Course: HPI: 42 year old female with a past medical history of seizure disorder, hypertension, bipolar disorder, intellectual disability who presents to the emergency department accompanied by her caregiver [name] ([phone]), [name] provides history at the bedside that the patient had a seizure yesterday at approximately noon for 3 minutes time and then subsequently had a postictal state for approximately half an hour however when the patient woke up this morning covered in urine the patient has been unresponsive to tactile or verbal stimuli and at baseline patient walks up and down stairs talks and showers.  Prior to this the caregiver does note increased behavioral symptoms namely the patient jumped out of a moving car with a previous caregiver.     Of note the patient has not had her Vimpat for the last 2 days as per the caregiver stating that the pharmacy had run out of the medication.  Case discussed with the ED physician and neurology has already been consulted and plans for overnight EEG although initial impression was behavioral rather than nonconvulsive status etc.   In the emergency department the patient received lacosamide 200 mg IV as well as Keppra 1500 mg IV.  As per the caregiver the patient has consistently been full code and voiced this on multiple occasions in the past as well.   Significant Events: Pt admitted 07-21-2022 Pt prepped for discharge 07-23-2022. Pt noted to have spontaneous voluntary movements and would intermittently answer questions.  Uncapped, unmarked syringe with needle found in her bed. UDS negative. Pt refused to walk or take oral meds. Discharge canceled. 7/16. Continued to decline enteral nutrition/medications. High suspicion for malingering per psychiatry.  PT/OT evaluations to inform venue of disposition as group home states  they will ne accept her back.  Subjective: Pt again not interactive with me. No enteral intake per sitter. Has been pulling at NG at times.   Objective: BP 131/85 (BP Location: Left Arm)   Pulse 82   Temp 99.1 F (37.3 C) (Oral)   Resp 18   Ht 5\' 2"  (1.575 m)   Wt 62.1 kg   SpO2 97%   BMI 25.02 kg/m   Gen: No distress, awakens to voice, tracks, phonates but doesn't verbalize.  Pulm: Clear, nonlabored  CV: RRR, no MRG or edema GI: Soft, NT, ND, +BS  Neuro: Not cooperative with exam  Skin: No rashes, lesions or ulcers on visualized skin    Assessment & Plan: Seizure disorder, poorly controlled. Likely due to recent lapse in delivery of her chronic AEDs. CT head was unremarkable - Pt's caregiver states pt has adequate supply of all 3 of her AEDs now. Continue keppra, vimpat, zonisamide through NG.  - Admitting hospitalist discussed with neurologist and does not think nonconvulsive status rather thinks behavioral, EEG negative for seizure.    Malingering: Pt with noted spontaneous voluntary movements of her arms/hand/legs/feet. Resistant to taking her meds. Pt clearly tracking with her eyes and moving her head. There is secondary gains by patient in staying in the hospital consistent with caregiver's impression of attention-seeking behavior.  - Pt made aware that if she continues to refuse to take her meds, ambulate and comply with her care, that her caregiver Kristi King will no longer be able to care for her in the caregiver's home and pt will be placed into another facility. PT/OT consulted. - There are no active medical issues to necessitate inpatient management.  Once safe disposition venue is secured, patient can be discharged.   Intellectual disability Has caregiver but functional at baseline. Talks a lot per caregiver and is ambulatory. Pt walks up and down stairs per her caregiver Kristi King.  Pt lives with caregiver in the caregiver's home. Pt is part of a Assisted  Family Living program by [facility] [place], [place].   Bipolar disorder [facility]) Psych consulted. They will follow. Pt found with uncapped needle and syringe in her bed 7/15. RN verified with pharmacy that no such syringe with needle is dispensed anywhere in hospital.  Given that I have verified that pt has hx of attention seeking behavior and this "catatonic" state has happened before with patient, I feel confident that pt's current behavior is due to secondary gain and not from seizures/post-ictal state.    Hypercholesterolemia: Normal CK.  - Continue gemfibrozil.   NAGMA: Suspect zonisamide is culprit. - Continue oral bicarbonate, improving.    Essential hypertension: Remains normotensive without medication.   Acute encephalopathy: Post-ictal vs. behavioral based on neurology and psychiatry work up and impressions.  - Psych has started IM/PO Ativan TID but this was stopped yesterday.  - Has some voluntary movements, and as outlined in detail elsewhere, there is great suspicion for nonorganic cause of presentation currently. EEG showed no ongoing seizures and she does not have findings during my time with her nor reported by sitters consistent with nonconvulsive status.  - With the degree of departure from her prior baseline and inability to perform/have her cooperate with a full neurologic exam, will order MRI brain.   Anxiety disorder with care-seeking behavior Patient's caregiver Kristi King with whom the patient lives under a assisted family living agreement through [facility] [place] states the patient has a history of attention seeking behavior. Patient goes to adult daycare and mimics many of the other clients symptoms in order to gain attention. Patient has recently started urinating on herself in order to gain attention. Caregiver states the patient will often ask for surgery or other procedures in order to gain attention.   Hypokalemia:  -  Continue supplementation.    [name], MD [facility] [email or web address] 07/26/2022, 11:25 AM

## 2022-07-26 NOTE — Plan of Care (Signed)

## 2022-07-26 NOTE — Progress Notes (Signed)
Physical Therapy Treatment Patient Details Name: Kristi King MRN: [id number] DOB: Mar 15, 1980 Today's Date: 07/26/2022   History of Present Illness 42 y.o. female presents to [facility] hospital on 07/21/2022 after seizure the day prior. Pt in a catatonic state on 7/14. Pt suspected of malingering during admission. PMH includes seizure, hypotension, bipolar disorder, intellectual disability    PT Comments  Pt seen for PT tx with pt received in bed, nurse in room & assisting PRN throughout session. Pt tearful at times throughout session but nurse reports this has been occurring today. Pt without verbal communication with PT, poor initiation of functional tasks (pt does initiate moving BLE back onto bed x 1 time during session) without multimodal cuing/encouragement. Pt requires +2 for STS but when PT facilitates taking steps pt with improved weight bearing in BLE. Pt able to ambulate in room & bathroom with +2 fade to +1 HHA min assist. Pt able to stand at sink ~3 minutes with BUE support without LOB. Pt would benefit from ongoing PT services to address balance, gait with LRAD, & activity tolerance.     Assistance Recommended at Discharge Frequent or constant Supervision/Assistance  If plan is discharge home, recommend the following:  Can travel by private vehicle    A lot of help with walking and/or transfers;A lot of help with bathing/dressing/bathroom;Assistance with cooking/housework;Direct supervision/assist for medications management;Assistance with feeding;Assist for transportation;Direct supervision/assist for financial management;Help with stairs or ramp for entrance   No  Equipment Recommendations  Wheelchair (measurements PT);Wheelchair cushion (measurements PT)    Recommendations for Other Services       Precautions / Restrictions Precautions Precautions: Fall Precaution Comments: NG tube Restrictions Weight Bearing Restrictions: No     Mobility  Bed Mobility Overal bed  mobility: Needs Assistance Bed Mobility: Supine to Sit     Supine to sit: Max assist, HOB elevated     General bed mobility comments: Pt requires cuing to move BLE to EOB, cuing to pull trunk upright with pt requiring max assist overall.    Transfers Overall transfer level: Needs assistance Equipment used: 2 person hand held assist Transfers: Sit to/from Stand             General transfer comment: On first few attempts pt requires max assist +2 from EOB, pt able to progress to min assist +1 from standard chair. Upon first few standing attempts pt "hangs" on PT & nurse vs trying to weight bear through BLE. PT then provides cuing/facilitation to begin walking & pt begins weight bearing through BLE.    Ambulation/Gait Ambulation/Gait assistance:  (min assist +2 fad to min assist +1) Gait Distance (Feet): 10 Feet (+ 5 ft + 15 ft) Assistive device: 2 person hand held assist, 1 person hand held assist Gait Pattern/deviations: Decreased step length - right, Decreased step length - left, Decreased dorsiflexion - right, Decreased stride length, Decreased dorsiflexion - left Gait velocity: decreased     General Gait Details: Pt initially with slightly posterior lean, decreased foot clearance BLE. When pt ambulates with 1 person HHA pt constantly reaching for furniture with other UE for support but no overt LOB. Pt requires manual facilitation/tactile cuing to continue ambulating/forward movement.   Stairs             Wheelchair Mobility     Tilt Bed    Modified Rankin (Stroke Patients Only)       Balance Overall balance assessment: Needs assistance Sitting-balance support: Bilateral upper extremity supported, Feet supported Sitting balance-Leahy Scale:  Fair Sitting balance - Comments: supervision static sitting EOB & in recliner   Standing balance support: Bilateral upper extremity supported, Single extremity supported Standing balance-Leahy Scale: Fair                               Cognition Arousal/Alertness: Awake/alert Behavior During Therapy: Flat affect Overall Cognitive Status: History of cognitive impairments - at baseline                                 General Comments: Pt with intellectual disability at baseline. Non verbal throughout session. Poor initiation throughout session. Requires multimodal cuing to follow simple cuing/commands.        Exercises      General Comments General comments (skin integrity, edema, etc.): Pt assisted on the toilet but did not void. PT encouraged pt to participate in grooming tasks (brushing hair but pt only holds comb, washing face or brushing teeth but pt did not turn on water at sink, pulling mesh underwear over hipds but pt does not initiate).      Pertinent Vitals/Pain Pain Assessment Pain Assessment: Faces Faces Pain Scale: No hurt    Home Living                          Prior Function            PT Goals (current goals can now be found in the care plan section) Acute Rehab PT Goals Patient Stated Goal: pt does not state, caregiver goal for pt to ambulate based on chart PT Goal Formulation: Patient unable to participate in goal setting Time For Goal Achievement: 08/08/22 Progress towards PT goals: Progressing toward goals    Frequency    Min 3X/week      PT Plan Current plan remains appropriate    Co-evaluation              AM-PAC PT "6 Clicks" Mobility   Outcome Measure  Help needed turning from your back to your side while in a flat bed without using bedrails?: A Little Help needed moving from lying on your back to sitting on the side of a flat bed without using bedrails?: A Lot Help needed moving to and from a bed to a chair (including a wheelchair)?: A Little Help needed standing up from a chair using your arms (e.g., wheelchair or bedside chair)?: A Lot Help needed to walk in hospital room?: A Little Help needed climbing 3-5  steps with a railing? : A Lot 6 Click Score: 15    End of Session   Activity Tolerance: Patient tolerated treatment well Patient left: in chair;with nursing/sitter in room;with call bell/phone within reach (BUE mittens donned)   PT Visit Diagnosis: Other abnormalities of gait and mobility (R26.89);Muscle weakness (generalized) (M62.81);Other symptoms and signs involving the nervous system (R29.898);Unsteadiness on feet (R26.81)     Time: 4098-1191 PT Time Calculation (min) (ACUTE ONLY): 26 min  Charges:    $Therapeutic Activity: 23-37 mins PT General Charges $$ ACUTE PT VISIT: 1 Visit                     Aleda Grana, PT, DPT 07/26/22, 2:50 PM   Sandi Mariscal 07/26/2022, 2:48 PM

## 2022-07-27 ENCOUNTER — Inpatient Hospital Stay (HOSPITAL_COMMUNITY): Payer: Medicare Other

## 2022-07-27 DIAGNOSIS — R569 Unspecified convulsions: Secondary | ICD-10-CM | POA: Diagnosis not present

## 2022-07-27 LAB — RENAL FUNCTION PANEL
Albumin: 3.4 g/dL — ABNORMAL LOW (ref 3.5–5.0)
Anion gap: 11 (ref 5–15)
BUN: 7 mg/dL (ref 6–20)
CO2: 20 mmol/L — ABNORMAL LOW (ref 22–32)
Calcium: 9.5 mg/dL (ref 8.9–10.3)
Chloride: 108 mmol/L (ref 98–111)
Creatinine, Ser: 0.53 mg/dL (ref 0.44–1.00)
GFR, Estimated: >60 mL/min (ref >60)
Glucose, Bld: 109 mg/dL — ABNORMAL HIGH (ref 70–99)
Phosphorus: 2.8 mg/dL (ref 2.5–4.6)
Potassium: 3.4 mmol/L — ABNORMAL LOW (ref 3.5–5.1)
Sodium: 139 mmol/L (ref 135–145)

## 2022-07-27 LAB — MAGNESIUM: Magnesium: 1.8 mg/dL (ref 1.7–2.4)

## 2022-07-27 NOTE — Plan of Care (Signed)

## 2022-07-27 NOTE — TOC Progression Note (Signed)
Transition of Care [facility]) - Progression Note    Patient Details  Name: Kristi King MRN: [id number] Date of Birth: Jul 18, 1980  Transition of Care [facility] CM/SW Contact  Janae Bridgeman, RN Phone Number: 07/27/2022, 12:08 PM  Clinical Narrative:    CM met with the patient, [name], MSW with [facility], bedside nursing and patient's advocate and patient was more conversational in the room today.  Bedside nursing that patient is making progress with her today regarding advancing diet at this time and following instructions.    Advocate at the bedside is supportive to assist with determining patient's base line.  PASRR is still pending at this time.  Patient has bed offers but is unable to admit to SNF until NGT is discontinued, patient is able to follow commands and progress with her diet at this time.  CM and MSW with [facility] Team will continue to follow the patient for [facility] needs to admit to SNF versus Group home depending on patient's progression.     Barriers to Discharge: Continued Medical Work up  Expected Discharge Plan and Services   Discharge Planning Services: CM Consult Post Acute Care Choice: Skilled Nursing Facility Living arrangements for the past 2 months: Group Home Expected Discharge Date: 07/23/22                                     Social Determinants of Health (SDOH) Interventions SDOH Screenings   Food Insecurity: No Food Insecurity (07/24/2022)  Housing: Low Risk  (07/24/2022)  Transportation Needs: No Transportation Needs (07/24/2022)  Utilities: Not At Risk (07/24/2022)  Social Connections: Unknown (05/21/2022)   Received from [facility]  Tobacco Use: Low Risk  (07/26/2022)    Readmission Risk Interventions    07/27/2022   12:08 PM 07/23/2022   12:32 PM  Readmission Risk Prevention Plan  Post Dischage Appt Complete Complete  Medication Screening Complete Complete  Transportation Screening Complete Complete

## 2022-07-27 NOTE — Progress Notes (Signed)
Occupational Therapy Treatment Patient Details Name: Kristi King MRN: [id number] DOB: [birth year] Today's Date: 07/27/2022   History of present illness 42 y.o. female presents to [facility] hospital on 07/21/2022 after seizure the day prior. Pt in a catatonic state on 7/14. Pt suspected of malingering during admission. PMH includes seizure, hypotension, bipolar disorder, intellectual disability   OT comments  Patient demonstrating gains this treatment session but continues to be limited by cognition and difficulty following directions/commands. Patient was max assist to get to EOB and min assist for balance initially but progressed to supervision. Patient able to perform self feeding with holding [place] Ice with assistance to bring to mouth and able to wash face with HOH to initiate. Patient able to perform 2 stands from EOB to RW with mod assist. Patient will benefit from continued inpatient follow up therapy, <3 hours/day to address bathing, dressing, and following commands. Acute OT to continue to follow.    Recommendations for follow up therapy are one component of a multi-disciplinary discharge planning process, led by the attending physician.  Recommendations may be updated based on patient status, additional functional criteria and insurance authorization.    Assistance Recommended at Discharge Frequent or constant Supervision/Assistance  Patient can return home with the following  Two people to help with walking and/or transfers;Two people to help with bathing/dressing/bathroom;Assistance with cooking/housework;Direct supervision/assist for medications management;Direct supervision/assist for financial management;Assistance with feeding;Help with stairs or ramp for entrance;Assist for transportation   Equipment Recommendations  Other (comment) (defer)    Recommendations for Other Services      Precautions / Restrictions Precautions Precautions: Fall Precaution Comments: NG  tube Restrictions Weight Bearing Restrictions: No       Mobility Bed Mobility Overal bed mobility: Needs Assistance Bed Mobility: Rolling, Supine to Sit, Sit to Supine Rolling: Max assist   Supine to sit: Max assist, HOB elevated Sit to supine: Max assist   General bed mobility comments: max assist due to difficulty following commands    Transfers Overall transfer level: Needs assistance Equipment used: Rolling walker (2 wheels) Transfers: Sit to/from Stand Sit to Stand: Mod assist           General transfer comment: sit to stands from EOB to RW with mod assist to power up and for balance with limited standing tolernce     Balance Overall balance assessment: Needs assistance Sitting-balance support: Bilateral upper extremity supported, Feet supported Sitting balance-Leahy Scale: Fair Sitting balance - Comments: min assist initially and progressed to supervision for static sitting balance Postural control: Posterior lean Standing balance support: Bilateral upper extremity supported, During functional activity Standing balance-Leahy Scale: Fair Standing balance comment: BUE support with RW and  posterior bias                           ADL either performed or assessed with clinical judgement   ADL Overall ADL's : Needs assistance/impaired Eating/Feeding: Moderate assistance;Sitting Eating/Feeding Details (indicate cue type and reason): able to hold [place] ice while sitting on EOB with assistance to bring to mouth Grooming: Wash/dry hands;Wash/dry face;Moderate assistance;Sitting Grooming Details (indicate cue type and reason): HOH assist to initiate and cue to stay on tasks                               General ADL Comments: limited to cognition    Extremity/Trunk Assessment  Vision       Perception     Praxis      Cognition Arousal/Alertness: Awake/alert Behavior During Therapy: Flat affect Overall Cognitive  Status: History of cognitive impairments - at baseline                                 General Comments: non verbal during session with multimodal cues to follow commands        Exercises      Shoulder Instructions       General Comments      Pertinent Vitals/ Pain       Pain Assessment Pain Assessment: Faces Faces Pain Scale: No hurt Pain Intervention(s): Monitored during session  Home Living                                          Prior Functioning/Environment              Frequency  Min 2X/week        Progress Toward Goals  OT Goals(current goals can now be found in the care plan section)  Progress towards OT goals: Progressing toward goals  Acute Rehab OT Goals OT Goal Formulation: Patient unable to participate in goal setting Time For Goal Achievement: 08/08/22 Potential to Achieve Goals: Fair ADL Goals Pt Will Perform Grooming: sitting;with min assist Pt Will Perform Upper Body Dressing: with min assist;sitting Pt Will Transfer to Toilet: stand pivot transfer;bedside commode;with min assist Additional ADL Goal #1: Pt will complete 1 step ADL task with min cues  Plan Discharge plan remains appropriate    Co-evaluation                 AM-PAC OT "6 Clicks" Daily Activity     Outcome Measure   Help from another person eating meals?: A Lot Help from another person taking care of personal grooming?: A Lot Help from another person toileting, which includes using toliet, bedpan, or urinal?: A Lot Help from another person bathing (including washing, rinsing, drying)?: A Lot Help from another person to put on and taking off regular upper body clothing?: A Lot Help from another person to put on and taking off regular lower body clothing?: Total 6 Click Score: 11    End of Session Equipment Utilized During Treatment: Gait belt;Rolling walker (2 wheels)  OT Visit Diagnosis: Unsteadiness on feet (R26.81);Muscle  weakness (generalized) (M62.81);Other symptoms and signs involving cognitive function   Activity Tolerance Other (comment) (limited due to cognition)   Patient Left in bed;with call bell/phone within reach;with bed alarm set;with nursing/sitter in room   Nurse Communication Mobility status        Time: 0728-0803 OT Time Calculation (min): 35 min  Charges: OT General Charges $OT Visit: 1 Visit OT Treatments $Self Care/Home Management : 8-22 mins $Therapeutic Activity: 8-22 mins  Alfonse Flavors, OTA Acute Rehabilitation Services  Office [phone]   Dewain Penning 07/27/2022, 10:45 AM

## 2022-07-27 NOTE — Progress Notes (Signed)
Name: Kristi King DOB: 02-08-1980  Please be advised that the above-named patient will require a short-term nursing home stay -- anticipated 30 days or less for rehabilitation and strengthening. The plan is for return home.
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
## 2022-07-28 DIAGNOSIS — R569 Unspecified convulsions: Secondary | ICD-10-CM | POA: Diagnosis not present

## 2022-07-28 LAB — RENAL FUNCTION PANEL
Albumin: 3.3 g/dL — ABNORMAL LOW (ref 3.5–5.0)
Anion gap: 13 (ref 5–15)
BUN: 9 mg/dL (ref 6–20)
CO2: 21 mmol/L — ABNORMAL LOW (ref 22–32)
Calcium: 9.4 mg/dL (ref 8.9–10.3)
Chloride: 104 mmol/L (ref 98–111)
Creatinine, Ser: 0.55 mg/dL (ref 0.44–1.00)
GFR, Estimated: >60 mL/min (ref >60)
Glucose, Bld: 89 mg/dL (ref 70–99)
Phosphorus: 3.4 mg/dL (ref 2.5–4.6)
Potassium: 3.3 mmol/L — ABNORMAL LOW (ref 3.5–5.1)
Sodium: 138 mmol/L (ref 135–145)

## 2022-07-28 LAB — MAGNESIUM: Magnesium: 1.9 mg/dL (ref 1.7–2.4)

## 2022-07-28 MED ORDER — ACETAMINOPHEN 325 MG PO TABS
650.0000 mg | ORAL_TABLET | Freq: Four times a day (QID) | ORAL | Status: DC | PRN
  Administered 2022-07-29: 650 mg via ORAL
  Filled 2022-07-28 (×2): qty 2

## 2022-07-28 NOTE — Plan of Care (Signed)
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
## 2022-07-29 ENCOUNTER — Inpatient Hospital Stay (HOSPITAL_COMMUNITY): Payer: Medicare Other

## 2022-07-29 DIAGNOSIS — R569 Unspecified convulsions: Secondary | ICD-10-CM | POA: Diagnosis not present

## 2022-07-29 LAB — RENAL FUNCTION PANEL
Albumin: 3.1 g/dL — ABNORMAL LOW (ref 3.5–5.0)
Anion gap: 11 (ref 5–15)
BUN: 9 mg/dL (ref 6–20)
CO2: 21 mmol/L — ABNORMAL LOW (ref 22–32)
Calcium: 9.2 mg/dL (ref 8.9–10.3)
Chloride: 104 mmol/L (ref 98–111)
Creatinine, Ser: 0.59 mg/dL (ref 0.44–1.00)
GFR, Estimated: >60 mL/min (ref >60)
Glucose, Bld: 126 mg/dL — ABNORMAL HIGH (ref 70–99)
Phosphorus: 3.1 mg/dL (ref 2.5–4.6)
Potassium: 3.2 mmol/L — ABNORMAL LOW (ref 3.5–5.1)
Sodium: 136 mmol/L (ref 135–145)

## 2022-07-29 MED ORDER — KCL IN DEXTROSE-NACL 20-5-0.9 MEQ/L-%-% IV SOLN
INTRAVENOUS | Status: DC
  Filled 2022-07-29 (×4): qty 1000
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
## 2022-07-29 NOTE — Plan of Care (Signed)

## 2022-07-29 NOTE — Progress Notes (Signed)
Requested information uploaded to [facility] for review. Pt's PASRR is pending.   Dellie Burns, MSW, LCSW [phone] (coverage)

## 2022-07-30 DIAGNOSIS — R569 Unspecified convulsions: Secondary | ICD-10-CM | POA: Diagnosis not present

## 2022-07-30 LAB — RENAL FUNCTION PANEL
Albumin: 3 g/dL — ABNORMAL LOW (ref 3.5–5.0)
Anion gap: 7 (ref 5–15)
BUN: 9 mg/dL (ref 6–20)
CO2: 20 mmol/L — ABNORMAL LOW (ref 22–32)
Calcium: 8.6 mg/dL — ABNORMAL LOW (ref 8.9–10.3)
Chloride: 110 mmol/L (ref 98–111)
Creatinine, Ser: 0.48 mg/dL (ref 0.44–1.00)
GFR, Estimated: >60 mL/min (ref >60)
Glucose, Bld: 104 mg/dL — ABNORMAL HIGH (ref 70–99)
Phosphorus: 2.9 mg/dL (ref 2.5–4.6)
Potassium: 3.8 mmol/L (ref 3.5–5.1)
Sodium: 137 mmol/L (ref 135–145)

## 2022-07-30 NOTE — Progress Notes (Signed)
[facility] PROGRESS NOTE  Kristi King (DOB: 18-Sep-1980) WUJ:[id number] PCP: Leilani Able, MD  Brief Narrative: Patient is a 42 year old female with a past medical history significant for seizure disorder, hypertension, bipolar disorder and intellectual disability.  Patient may not have been compliant with Vimpat prior to admission.  Patient was admitted with seizure that lasted for 3 minutes, with subsequent postictal state.  There were some concerns for possible behavioral problems as well.  On presentation, case was discussed with the ED physician and neurology had already been consulted and plans for overnight EEG although initial impression was behavioral rather than nonconvulsive status etc.   Significant Events: Pt admitted 07-21-2022 Pt prepped for discharge 07-23-2022. Pt noted to have spontaneous voluntary movements and would intermittently answer questions.  Uncapped, unmarked syringe with needle found in her bed. UDS negative. Pt refused to walk or take oral meds. Discharge canceled. 7/16. Continued to decline enteral nutrition/medications. High suspicion for malingering per psychiatry.  PT/OT evaluations to inform venue of disposition as group home states they will ne accept her back.  07/30/2022: No new changes.  Patient has remained stable.  Pursue disposition.  Was more communicative today.  Subjective: No complaints. No seizure activities.  Objective: BP 121/77 (BP Location: Left Arm)   Pulse 66   Temp 98.2 F (36.8 C) (Oral)   Resp 18   Ht 5\' 2"  (1.575 m)   Wt 62.1 kg   SpO2 100%   BMI 25.04 kg/m   Gen: No complication.   Pulm: Clear. CV: RRR, no MRG or edema GI: Soft, NT, ND, +BS  Neuro: Patient does not participate. Skin: No rashes, lesions or ulcers on visualized skin    Assessment & Plan: Seizure disorder, poorly controlled. Likely due to recent lapse in delivery of her chronic AEDs. CT head was unremarkable - Pt's caregiver states pt has adequate  supply of all 3 of her AEDs now. Continue keppra, vimpat, zonisamide through NG.  - Admitting hospitalist discussed with neurologist and does not think nonconvulsive status rather thinks behavioral, EEG negative for seizure. 07/30/2022: Stable.  Pursue disposition   Malingering: -Continue to monitor closely. -Pursue disposition.  Intellectual disability -Patient has caregiver  -At baseline, patient talks a lot per caregiver and is ambulatory. Pt walks up and down stairs per her caregiver Kristi King.  -Pt lives with caregiver in the caregiver's home. Pt is part of a Assisted Family Living program by [facility] [place], [place]. 07/30/2022: Pursue disposition.   Bipolar disorder (HCC) -Psych consulted. They will follow.    Hypercholesterolemia:  - Continue gemfibrozil.   NAGMA: Suspect zonisamide is culprit. - Continue oral bicarbonate, improving.    Essential hypertension: Remains normotensive without medication.   Acute encephalopathy: -Post-ictal vs. behavioral based on neurology and psychiatry work up and impressions.  -EEG did not reveal any seizure activities.   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  Anxiety disorder with care-seeking behavior    Hypokalemia:  -Potassium is 3.8 today. -Continue to monitor and replete.    Barnetta Chapel, MD [facility] [email or web address] 07/30/2022, 3:31 PM

## 2022-07-30 NOTE — TOC Progression Note (Addendum)
Transition of Care [facility]) - Progression Note    Patient Details  Name: Kristi King MRN: [id number] Date of Birth: 10-30-1980  Transition of Care [facility]) CM/SW Contact  Kenly Xiao A [name], [place] Phone Number: 07/30/2022, 12:23 PM  Clinical Narrative:     CSW met with pt's advocate, Chuwana at bedside, [phone]. She said that pt is exhibiting her normal baseline behaviors. She said that if pt is able to eat a meal and PT says that she can climb stairs then she would be able to go home. She said she will coordinate pt's transportation with pt's home assistance care taker, [name].   CSW will reach out to PT and updates pt's medical team regarding possible discharge.   TOC will continue to follow.     Barriers to Discharge: Continued Medical Work up  Expected Discharge Plan and Services   Discharge Planning Services: CM Consult Post Acute Care Choice: Skilled Nursing Facility Living arrangements for the past 2 months: Group Home Expected Discharge Date: 07/23/22                                     Social Determinants of Health (SDOH) Interventions SDOH Screenings   Food Insecurity: No Food Insecurity (07/24/2022)  Housing: Low Risk  (07/24/2022)  Transportation Needs: No Transportation Needs (07/24/2022)  Utilities: Not At Risk (07/24/2022)  Social Connections: Unknown (05/21/2022)   Received from [facility]  Tobacco Use: Low Risk  (07/26/2022)    Readmission Risk Interventions    07/27/2022   12:08 PM 07/23/2022   12:32 PM  Readmission Risk Prevention Plan  Post Dischage Appt Complete Complete  Medication Screening Complete Complete  Transportation Screening Complete Complete

## 2022-07-30 NOTE — TOC Transition Note (Signed)
Transition of Care [facility]) - CM/SW Discharge Note   Patient Details  Name: Kristi King MRN: [id number] Date of Birth: 25-Aug-1980  Transition of Care [facility]) CM/SW Contact:  Janae Bridgeman, RN Phone Number: 07/30/2022, 3:17 PM   Clinical Narrative:    CM spoke with Kiva [name], MSW and patient will likely be able to discharge to home tomorrow around 2 pm.  PT evaluated the patient for therapy today and home health is recommended.  I called Elnita Maxwell, RNCM with [facility] and she accepted the patient for [facility] PT services.  Home health orders were placed to be co-signed by the MD.  Patient can discharge home by car tomorrow by caregiver.   Final next level of care:  (SNF versus Group home pending patient's progression) Barriers to Discharge: Continued Medical Work up   Patient Goals and CMS Choice      Discharge Placement                         Discharge Plan and Services Additional resources added to the After Visit Summary for     Discharge Planning Services: CM Consult Post Acute Care Choice: Skilled Nursing Facility                               Social Determinants of Health (SDOH) Interventions SDOH Screenings   Food Insecurity: No Food Insecurity (07/24/2022)  Housing: Low Risk  (07/24/2022)  Transportation Needs: No Transportation Needs (07/24/2022)  Utilities: Not At Risk (07/24/2022)  Social Connections: Unknown (05/21/2022)   Received from [facility]  Tobacco Use: Low Risk  (07/26/2022)     Readmission Risk Interventions    07/27/2022   12:08 PM 07/23/2022   12:32 PM  Readmission Risk Prevention Plan  Post Dischage Appt Complete Complete  Medication Screening Complete Complete  Transportation Screening Complete Complete

## 2022-07-30 NOTE — Progress Notes (Signed)
Physical Therapy Treatment Patient Details Name: Kristi King MRN: [id number] DOB: 1980/02/04 Today's Date: 07/30/2022   History of Present Illness 42 y.o. female presents to [facility] hospital on 07/21/2022 after seizure the day prior. Pt in a catatonic state on 7/14. Pt suspected of malingering during admission. PMH includes seizure, hypotension, bipolar disorder, intellectual disability    PT Comments  Pt presents with improve mobility this session. She continues to require verbal cues to initiate movement and to safe mobilize with sit to stand and gait due to she is unstable on her feet and cognitively unsafe and unaware of deficits. Pt requires reminders for safety with sitting/standing to decrease risk for falls. Due to pt current functional status, home set up and available assistance at home recommending skilled physical therapy services at a lower frequency of 3x/weekly on discharge from acute care hospital setting in order to decrease risk for falls, injury and re-hospitalization. Pt demonstrates no signs/symptoms of cardiac/respiratory distress throughout session.      Assistance Recommended at Discharge Frequent or constant Supervision/Assistance  If plan is discharge home, recommend the following:  Can travel by private vehicle    A little help with walking and/or transfers;Assist for transportation;Assistance with cooking/housework;Help with stairs or ramp for entrance   Yes  Equipment Recommendations  Rolling walker (2 wheels)    Recommendations for Other Services       Precautions / Restrictions Precautions Precautions: Fall Restrictions Weight Bearing Restrictions: No     Mobility  Bed Mobility Overal bed mobility: Needs Assistance Bed Mobility: Rolling, Supine to Sit Rolling: Supervision   Supine to sit: Supervision     General bed mobility comments: pt left in recliner at end of session with sitter in the room Patient Response: Cooperative, Flat  affect  Transfers Overall transfer level: Needs assistance Equipment used: Rolling walker (2 wheels) Transfers: Sit to/from Stand Sit to Stand: Supervision           General transfer comment: sit to stands from EOB to RW with supervision due to slight multi directional sway    Ambulation/Gait Ambulation/Gait assistance: Supervision Gait Distance (Feet): 200 Feet (+15 from EOB to recliner) Assistive device: Rolling walker (2 wheels), None (furniture walked without AD) Gait Pattern/deviations: Decreased step length - right, Decreased step length - left, Decreased dorsiflexion - right, Decreased stride length, Decreased dorsiflexion - left, Drifts right/left Gait velocity: decreased Gait velocity interpretation: <1.8 ft/sec, indicate of risk for recurrent falls   General Gait Details: Pt tends to drift R and L  but is able to correct prior to running into obstacles. Pt has difficulty following cues to increase step length for improved mobility, follows cues well for improved posture but requires intermittent cuing to reset   Stairs Stairs:  (pt states she doesnt have stairs at home)             Tilt Bed Tilt Bed Patient Response: Cooperative, Flat affect  Modified Rankin (Stroke Patients Only)       Balance Overall balance assessment: Needs assistance Sitting-balance support: Single extremity supported, No upper extremity supported, Feet supported, Feet unsupported Sitting balance-Leahy Scale: Good     Standing balance support: Bilateral upper extremity supported, During functional activity, No upper extremity supported, Single extremity supported Standing balance-Leahy Scale: Fair Standing balance comment: no overt lob multidirectional sway          Cognition Arousal/Alertness: Awake/alert Behavior During Therapy: Flat affect Overall Cognitive Status: History of cognitive impairments - at baseline  General Comments: Pt was talkative during session  stated she liuved in a AFL multtiple times throughout session               Pertinent Vitals/Pain Pain Assessment Pain Assessment: Faces Faces Pain Scale: No hurt Breathing: normal Negative Vocalization: none Facial Expression: smiling or inexpressive Body Language: relaxed Consolability: no need to console PAINAD Score: 0 Pain Intervention(s): Monitored during session     PT Goals (current goals can now be found in the care plan section) Acute Rehab PT Goals Patient Stated Goal: pt does not state, caregiver goal for pt to ambulate based on chart PT Goal Formulation: Patient unable to participate in goal setting Time For Goal Achievement: 08/08/22 Potential to Achieve Goals: Fair Progress towards PT goals: Progressing toward goals    Frequency    Min 3X/week      PT Plan Discharge plan needs to be updated       AM-PAC PT "6 Clicks" Mobility   Outcome Measure  Help needed turning from your back to your side while in a flat bed without using bedrails?: A Little Help needed moving from lying on your back to sitting on the side of a flat bed without using bedrails?: A Little Help needed moving to and from a bed to a chair (including a wheelchair)?: A Little Help needed standing up from a chair using your arms (e.g., wheelchair or bedside chair)?: A Little Help needed to walk in hospital room?: A Little Help needed climbing 3-5 steps with a railing? : A Little 6 Click Score: 18    End of Session Equipment Utilized During Treatment: Gait belt Activity Tolerance: Patient tolerated treatment well Patient left: in chair;with nursing/sitter in room;with call bell/phone within reach Nurse Communication: Mobility status PT Visit Diagnosis: Other abnormalities of gait and mobility (R26.89);Muscle weakness (generalized) (M62.81);Other symptoms and signs involving the nervous system (R29.898);Unsteadiness on feet (R26.81)     Time: 9604-5409 PT Time Calculation (min)  (ACUTE ONLY): 23 min  Charges:    $Gait Training: 8-22 mins $Therapeutic Activity: 8-22 mins PT General Charges $$ ACUTE PT VISIT: 1 Visit                     Harrel Carina, DPT, CLT  Acute Rehabilitation Services Office: [phone] (Secure chat preferred)    Claudia Desanctis 07/30/2022, 10:41 AM

## 2022-07-30 NOTE — Progress Notes (Signed)
Physical Therapy Treatment Patient Details Name: Kristi King MRN: [id number] DOB: 09-25-1980 Today's Date: 07/30/2022   History of Present Illness 42 y.o. female presents to [facility] hospital on 07/21/2022 after seizure the day prior. Pt in a catatonic state on 7/14. Pt suspected of malingering during admission. PMH includes seizure, hypotension, bipolar disorder, intellectual disability    PT Comments  Pt advocate came by and stated pt is close to baseline but does have stairs at home and needs to be able to navigate stairs to return home. Pt was seen for a second time to perform stairs. Pt navigated 10 stairs ascending/descending with oscillating between an alternating and step to pattern at CGA with bil rails (pt states she has bil rails at home though pt is a questionable historian). Pt will require someone to be with her for safety providing supervision/CGA for mobility to decrease risk for falls. Due to pt current functional status, home set up and available assistance at home recommending skilled physical therapy services at a lower frequency of 3x/weekly on discharge from acute care hospital setting in order to decrease risk for falls, injury and re-hospitalization. Pt demonstrates no signs/symptoms of cardiac/respiratory distress throughout session.       Assistance Recommended at Discharge Set up Supervision/Assistance  If plan is discharge home, recommend the following:  Can travel by private vehicle    A little help with walking and/or transfers;Assist for transportation;Assistance with cooking/housework;Help with stairs or ramp for entrance   Yes  Equipment Recommendations  Rolling walker (2 wheels)    Recommendations for Other Services       Precautions / Restrictions Precautions Precautions: Fall Restrictions Weight Bearing Restrictions: No     Mobility  Bed Mobility Overal bed mobility: Needs Assistance Bed Mobility: Sit to Supine Rolling: Supervision   Supine to  sit: Supervision Sit to supine: Supervision   General bed mobility comments: Pt received sitting in recliner. Needs extra time to get to supine. Patient Response: Cooperative, Flat affect  Transfers Overall transfer level: Needs assistance Equipment used: Rolling walker (2 wheels), None Transfers: Sit to/from Stand Sit to Stand: Supervision           General transfer comment: sit to stands from EOB and recliner with and without RW with supervision due to slight multi directional sway    Ambulation/Gait Ambulation/Gait assistance: Supervision Gait Distance (Feet): 300 Feet Assistive device: Rolling walker (2 wheels), None Gait Pattern/deviations: Decreased step length - right, Decreased step length - left, Decreased dorsiflexion - right, Decreased stride length, Decreased dorsiflexion - left, Drifts right/left Gait velocity: decreased Gait velocity interpretation: <1.8 ft/sec, indicate of risk for recurrent falls   General Gait Details: Improved drifting this session with 1-2 bouts that pt was able to self correct without cueing. Attempting 50 ft of gait with bil HHA with significant increase in speed. pt did not increase speed with multi modal cueing while using the AD only minimal results including using floor tiles, mirroring, verbal cues   Stairs Stairs: Yes (pt advocate came and stated that pt does have stairs and needs to be able to navigate stairs to go home.) Stairs assistance: Min guard Stair Management: Two rails, Step to pattern, Alternating pattern, Forwards Number of Stairs: 10 General stair comments: Close CGA for safety. No buckling at the knees; pt is slightly unstable and would need someone with her when navigating stairs until balance and strength improve.   Wheelchair Mobility     Tilt Bed Tilt Bed Patient Response: Cooperative, Flat  affect  Modified Rankin (Stroke Patients Only)       Balance Overall balance assessment: Needs  assistance Sitting-balance support: Single extremity supported, No upper extremity supported, Feet supported, Feet unsupported Sitting balance-Leahy Scale: Good     Standing balance support: Bilateral upper extremity supported, During functional activity, No upper extremity supported, Single extremity supported Standing balance-Leahy Scale: Fair Standing balance comment: no overt LOB; multidirectional sway present without UE support       Cognition Arousal/Alertness: Awake/alert Behavior During Therapy: Flat affect Overall Cognitive Status: History of cognitive impairments - at baseline         General Comments: Pt was quiet throughtout session, Answering questions appropriately           General Comments General comments (skin integrity, edema, etc.): Initially pt stated she does not have stairs at her home. Pt advocate came by and stated pt has a full flight of stairs to her room and needs to be able to navigate to return home.      Pertinent Vitals/Pain Pain Assessment Pain Assessment: No/denies pain Faces Pain Scale: No hurt Breathing: normal Negative Vocalization: none Facial Expression: smiling or inexpressive Body Language: relaxed Consolability: no need to console PAINAD Score: 0 Pain Intervention(s): Monitored during session     PT Goals (current goals can now be found in the care plan section) Acute Rehab PT Goals Patient Stated Goal: pt does not state, caregiver goal for pt to ambulate based on chart PT Goal Formulation: Patient unable to participate in goal setting Time For Goal Achievement: 08/08/22 Potential to Achieve Goals: Fair Progress towards PT goals: Progressing toward goals    Frequency    Min 3X/week      PT Plan Current plan remains appropriate       AM-PAC PT "6 Clicks" Mobility   Outcome Measure  Help needed turning from your back to your side while in a flat bed without using bedrails?: A Little Help needed moving from lying  on your back to sitting on the side of a flat bed without using bedrails?: A Little Help needed moving to and from a bed to a chair (including a wheelchair)?: A Little Help needed standing up from a chair using your arms (e.g., wheelchair or bedside chair)?: A Little Help needed to walk in hospital room?: A Little Help needed climbing 3-5 steps with a railing? : A Little 6 Click Score: 18    End of Session Equipment Utilized During Treatment: Gait belt Activity Tolerance: Patient tolerated treatment well Patient left: in bed;with nursing/sitter in room;with call bell/phone within reach Nurse Communication: Mobility status PT Visit Diagnosis: Other abnormalities of gait and mobility (R26.89);Muscle weakness (generalized) (M62.81);Other symptoms and signs involving the nervous system (R29.898);Unsteadiness on feet (R26.81)     Time: 1410-1443 PT Time Calculation (min) (ACUTE ONLY): 33 min  Charges:    $Gait Training: 23-37 mins PT General Charges $$ ACUTE PT VISIT: 1 Visit                     Harrel Carina, DPT, CLT  Acute Rehabilitation Services Office: [phone] (Secure chat preferred)    Claudia Desanctis 07/30/2022, 2:54 PM

## 2022-07-30 NOTE — TOC Progression Note (Signed)
Transition of Care [facility]) - Progression Note    Patient Details  Name: LESIELI BRESEE MRN: [id number] Date of Birth: 09-26-1980  Transition of Care [facility]) CM/SW Contact  Eilene Voigt A [name], [place] Phone Number: 07/30/2022, 10:26 AM  Clinical Narrative:     Pt's Passr status was updated in the system. PT received an [id number] E, approval and expiration dates, 07/30/2022, 08/29/2022.   CSW will updated FL2.  TOC will continue to follow.     Barriers to Discharge: Continued Medical Work up  Expected Discharge Plan and Services   Discharge Planning Services: CM Consult Post Acute Care Choice: Skilled Nursing Facility Living arrangements for the past 2 months: Group Home Expected Discharge Date: 07/23/22                                     Social Determinants of Health (SDOH) Interventions SDOH Screenings   Food Insecurity: No Food Insecurity (07/24/2022)  Housing: Low Risk  (07/24/2022)  Transportation Needs: No Transportation Needs (07/24/2022)  Utilities: Not At Risk (07/24/2022)  Social Connections: Unknown (05/21/2022)   Received from [facility]  Tobacco Use: Low Risk  (07/26/2022)    Readmission Risk Interventions    07/27/2022   12:08 PM 07/23/2022   12:32 PM  Readmission Risk Prevention Plan  Post Dischage Appt Complete Complete  Medication Screening Complete Complete  Transportation Screening Complete Complete

## 2022-07-31 ENCOUNTER — Encounter (HOSPITAL_COMMUNITY): Payer: Self-pay | Admitting: Internal Medicine

## 2022-07-31 DIAGNOSIS — R569 Unspecified convulsions: Secondary | ICD-10-CM | POA: Diagnosis not present

## 2022-07-31 MED ORDER — LEVETIRACETAM 100 MG/ML PO SOLN: 1500.0000 mg | Freq: Two times a day (BID) | ORAL | 12 refills | Status: AC

## 2022-07-31 MED ORDER — POTASSIUM CHLORIDE CRYS ER 20 MEQ PO TBCR: 20.0000 meq | EXTENDED_RELEASE_TABLET | Freq: Every day | ORAL | 0 refills | Status: AC

## 2022-07-31 MED ORDER — SODIUM BICARBONATE 650 MG PO TABS: 650.0000 mg | ORAL_TABLET | Freq: Three times a day (TID) | ORAL | 0 refills | Status: AC

## 2022-07-31 MED ORDER — ARIPIPRAZOLE 15 MG PO TABS: 15.0000 mg | ORAL_TABLET | Freq: Every day | ORAL | 0 refills | Status: AC

## 2022-07-31 MED ORDER — LACOSAMIDE 10 MG/ML PO SOLN: 200.0000 mg | Freq: Two times a day (BID) | ORAL | 1 refills | Status: AC

## 2022-07-31 MED ORDER — LEVETIRACETAM 100 MG/ML PO SOLN: 1500.0000 mg | Freq: Two times a day (BID) | ORAL | 12 refills | Status: DC

## 2022-07-31 NOTE — Plan of Care (Signed)

## 2022-07-31 NOTE — Discharge Summary (Signed)
Physician Discharge Summary  Kristi King UJW:[id number] DOB: [birth year] DOA: 07/21/2022  PCP: Leilani Able, MD  Admit date: 07/21/2022  Discharge date: 07/31/2022  Admitted From:Group home  Disposition:  Group home  Recommendations for Outpatient Follow-up:  Follow up with PCP in 1-2 weeks Continue all medications as prescribed with Keppra, Vimpat, zonisamide Continue other home medications as prior  Home Health: Yes with PT  Equipment/Devices: None  Discharge Condition:Stable  CODE STATUS: Full  Diet recommendation: Heart Healthy  Brief/Interim Summary: Patient is a 42 year old female with a past medical history significant for seizure disorder, hypertension, bipolar disorder and intellectual disability.  Patient may not have been compliant with Vimpat prior to admission.  Patient was admitted with seizure that lasted for 3 minutes, with subsequent postictal state.  There were some concerns for possible behavioral problems as well.  On presentation, case was discussed with the ED physician and neurology who recommended EEG which was negative.  She is being prepared for discharge on 7/15 and was noted to have an unmarked syringe with needle found in her bed.  UDS negative.  She started to have some issues that resemble malingering and declined enteral nutrition and medications and was seen by psychiatry.  She is now tolerating oral intake as well as medications and is in stable condition for discharge back to her group home with PT recommending home health services.  Medication refills and adjustments provided.  Discharge Diagnoses:  Principal Problem:   Seizure [facility]) Active Problems:   Malingering   Acute encephalopathy   Essential hypertension   Metabolic acidosis   Hypercholesterolemia   Bipolar disorder (HCC)   Intellectual disability   Body mass index (BMI) 40.0-44.9, adult (HCC)   Hypokalemia   Anxiety disorder with care-seeking behavior  Principal discharge  diagnosis: Seizure disorder in the setting of recent labs and delivery of chronic AEDs.  Anxiety disorder with care seeking behavior.  Discharge Instructions  Discharge Instructions     Call MD for:  difficulty breathing, headache or visual disturbances   Complete by: As directed    Call MD for:  persistant dizziness or light-headedness   Complete by: As directed    Call MD for:  persistant nausea and vomiting   Complete by: As directed    Call MD for:  redness, tenderness, or signs of infection (pain, swelling, redness, odor or green/yellow discharge around incision site)   Complete by: As directed    Call MD for:  severe uncontrolled pain   Complete by: As directed    Call MD for:  temperature >100.4   Complete by: As directed    Diet - low sodium heart healthy   Complete by: As directed    Diet - low sodium heart healthy   Complete by: As directed    Discharge instructions   Complete by: As directed    1. Follow up with PCP within 1-2 weeks of discharge. 2. Follow up with neurology within 2-4 weeks of discharge.   Incontinence supply   Complete by: As directed    Adult diapers #100. Use prn.   Increase activity slowly   Complete by: As directed    Increase activity slowly   Complete by: As directed       Allergies as of 07/31/2022       Reactions   Dilantin [phenytoin]    Tegretol [carbamazepine]         Medication List     STOP taking these medications    lacosamide  200 MG Tabs tablet Commonly known as: VIMPAT Replaced by: lacosamide 10 MG/ML oral solution   levETIRAcetam 750 MG tablet Commonly known as: KEPPRA Replaced by: levETIRAcetam 100 MG/ML solution       TAKE these medications    ARIPiprazole 15 MG tablet Commonly known as: ABILIFY Take 1 tablet (15 mg total) by mouth daily. What changed: how much to take   cetirizine 10 MG tablet Commonly known as: ZYRTEC Take 10 mg by mouth daily.   Cholecalciferol 50 MCG (2000 UT) Tabs Take 2,000  Units by mouth daily.   ciclopirox 8 % solution Commonly known as: PENLAC Apply 1 Application topically daily. Remove after 7 days.   dexlansoprazole 60 MG capsule Commonly known as: DEXILANT Take 60 mg by mouth daily.   gabapentin 300 MG capsule Commonly known as: NEURONTIN Take 300 mg by mouth daily.   gabapentin 400 MG capsule Commonly known as: NEURONTIN Take 400 mg by mouth at bedtime.   gemfibrozil 600 MG tablet Commonly known as: LOPID Take 600 mg by mouth 2 (two) times daily before a meal.   lacosamide 10 MG/ML oral solution Commonly known as: VIMPAT Take 20 mLs (200 mg total) by mouth 2 (two) times daily. Take (200mg  total) via G tube twice daily. Replaces: lacosamide 200 MG Tabs tablet   levETIRAcetam 100 MG/ML solution Commonly known as: KEPPRA Place 15 mLs (1,500 mg total) into feeding tube 2 (two) times daily. Replaces: levETIRAcetam 750 MG tablet   Melatonin 10 MG Tabs Take 10 mg by mouth at bedtime.   mirtazapine 15 MG tablet Commonly known as: REMERON Take 15 mg by mouth daily.   potassium chloride SA 20 MEQ tablet Commonly known as: KLOR-CON M Take 1 tablet (20 mEq total) by mouth daily.   sodium bicarbonate 650 MG tablet Take 1 tablet (650 mg total) by mouth 3 (three) times daily.   SYSTANE COMPLETE OP Apply 1 drop to eye as needed (Dry eye).   zonisamide 100 MG capsule Commonly known as: ZONEGRAN Take 100 mg by mouth 2 (two) times daily.        Follow-up Information     [facility] Follow up.   Why: [facility] will providing home health PT.  They will call you to set up services in the next 24-48 hours. Contact information: [address] [place] [address] [phone]         Leilani Able, MD. Schedule an appointment as soon as possible for a visit in 1 week(s).   Specialty: Family Medicine Contact information: [address] [place] [address] [phone]                Allergies   Allergen Reactions   Dilantin [Phenytoin]    Tegretol [Carbamazepine]     Consultations: Psychiatry Neurology   Procedures/Studies: MR BRAIN WO CONTRAST  Result Date: 07/29/2022 CLINICAL DATA:  Mental status change, unknown cause. EXAM: MRI HEAD WITHOUT CONTRAST TECHNIQUE: Multiplanar, multiecho pulse sequences of the brain and surrounding structures were obtained without intravenous contrast. COMPARISON:  Head CT 07/21/2022. FINDINGS: Brain: No acute infarct or hemorrhage. 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 No mass or midline shift. No hydrocephalus or extra-axial collection. No abnormal susceptibility. Vascular: Normal flow voids. Skull and upper cervical spine: Normal marrow signal. Sinuses/Orbits: Unremarkable. Other: None. IMPRESSION: 1. No acute intracranial process. 2. 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 Electronically Signed   By: Orvan Falconer M.D.   On:  07/29/2022 19:10   DG Pelvis 1-2 Views  Result Date: 07/27/2022 CLINICAL DATA:  Pelvic pain. EXAM: PELVIS - 1-2 VIEW COMPARISON:  None Available. FINDINGS: There is no evidence of pelvic fracture or diastasis. No pelvic bone lesions are seen. Mild bilateral hip osteoarthritis. IMPRESSION: Negative. Electronically Signed   By: Larose Hires D.O.   On: 07/27/2022 17:44   DG Abd 1 View  Result Date: 07/24/2022 CLINICAL DATA:  NG tube placement EXAM: ABDOMEN - 1 VIEW COMPARISON:  None Available. FINDINGS: Esophageal tube tip and side port overlie the mid to distal stomach. Upper gas pattern is unremarkable IMPRESSION: Esophageal tube tip and side port overlie the mid to distal stomach. Electronically Signed   By: Jasmine Pang M.D.   On: 07/24/2022 19:25   EEG adult  Result Date: 07/22/2022 Charlsie Quest, MD     07/22/2022  7:36 AM Patient Name: EXIE CHRISMER MRN: [id number] Epilepsy Attending: Charlsie Quest Referring Physician/Provider: Milon Dikes, MD Date: 07/22/2022  Duration: 24.45 mins Patient history: 42 year old past history of seizures, episodes concerning for nonepileptic spells, moderate intellectual disability amongst other comorbidities brought in for altered mental status with a presumed seizure. EEG to evaluate for seizure. Level of alertness:  lethargic AEDs during EEG study: LEV, LCM Technical aspects: This EEG study was done with scalp electrodes positioned according to the 10-20 International system of electrode placement. Electrical activity was reviewed with band pass filter of 1-70Hz , sensitivity of 7 uV/mm, display speed of 91mm/sec with a 60Hz  notched filter applied as appropriate. EEG data were recorded continuously and digitally stored.  Video monitoring was available and reviewed as appropriate. Description: No posterior dominant rhythm was seen. EEG showed near continuous generalized high amplitude 4-5Hz  theta slowing admixed with 9-12 Hz alpha activity.  Hyperventilation and photic stimulation were not performed.   ABNORMALITY - Continuous slow, generalized IMPRESSION: This study is suggestive of moderate diffuse encephalopathy, nonspecific etiology. No seizures or epileptiform discharges were seen throughout the recording. Please note lack of epileptiform activity during interictal EEG does not exclude the diagnosis of epilepsy. Charlsie Quest   DG CHEST PORT 1 VIEW  Result Date: 07/22/2022 CLINICAL DATA:  Recent seizure activity EXAM: PORTABLE CHEST 1 VIEW COMPARISON:  07/18/2021 FINDINGS: Cardiac shadow is within normal limits. Lungs are well aerated bilaterally. Increased vascular congestion is noted without significant edema. No focal infiltrate is noted. No bony abnormality is seen. IMPRESSION: Mild CHF without edema. Electronically Signed   By: Alcide Clever M.D.   On: 07/22/2022 00:51   CT Head Wo Contrast  Result Date: 07/21/2022 CLINICAL DATA:  Mental status change, unknown cause seizure EXAM: CT HEAD WITHOUT CONTRAST TECHNIQUE:  Contiguous axial images were obtained from the base of the skull through the vertex without intravenous contrast. RADIATION DOSE REDUCTION: This exam was performed according to the departmental dose-optimization program which includes automated exposure control, adjustment of the mA and/or kV according to patient size and/or use of iterative reconstruction technique. COMPARISON:  CT head 06/26/2020 FINDINGS: Brain: No evidence of large-territorial acute infarction. No parenchymal hemorrhage. No mass lesion. No extra-axial collection. No mass effect or midline shift. No hydrocephalus. Basilar cisterns are patent. Vascular: No hyperdense vessel. Skull: No acute fracture or focal lesion. Sinuses/Orbits: Paranasal sinuses and mastoid air cells are clear. The orbits are unremarkable. Other: None. IMPRESSION: No acute intracranial abnormality. Electronically Signed   By: Tish Frederickson M.D.   On: 07/21/2022 18:29     Discharge Exam: Vitals:   07/30/22  2346 07/31/22 0430  BP: 113/69 112/84  Pulse: 84 60  Resp: 18 18  Temp:  97.8 F (36.6 C)  SpO2: 98% 99%   Vitals:   07/30/22 1908 07/30/22 2346 07/31/22 0430 07/31/22 0709  BP: 120/79 113/69 112/84   Pulse: 87 84 60   Resp: 18 18 18    Temp: 97.8 F (36.6 C)  97.8 F (36.6 C)   TempSrc: Oral Oral Oral   SpO2: 99% 98% 99%   Weight:    82.2 kg  Height:        General: Pt is alert, awake, not in acute distress Cardiovascular: RRR, S1/S2 +, no rubs, no gallops Respiratory: CTA bilaterally, no wheezing, no rhonchi Abdominal: Soft, NT, ND, bowel sounds + Extremities: no edema, no cyanosis    The results of significant diagnostics from this hospitalization (including imaging, microbiology, ancillary and laboratory) are listed below for reference.     Microbiology: No results found for this or any previous visit (from the past 240 hour(s)).   Labs: BNP (last 3 results) No results for input(s): "BNP" in the last 8760 hours. Basic Metabolic  Panel: Recent Labs  Lab 07/26/22 0834 07/27/22 2048 07/28/22 0524 07/28/22 0525 07/29/22 2204 07/30/22 0556  NA 136 139  --  138 136 137  K 3.2* 3.4*  --  3.3* 3.2* 3.8  CL 105 108  --  104 104 110  CO2 20* 20*  --  21* 21* 20*  GLUCOSE 84 109*  --  89 126* 104*  BUN 8 7  --  9 9 9   CREATININE 0.55 0.53  --  0.55 0.59 0.48  CALCIUM 9.1 9.5  --  9.4 9.2 8.6*  MG  --  1.8 1.9  --   --   --   PHOS  --  2.8  --  3.4 3.1 2.9   Liver Function Tests: Recent Labs  Lab 07/26/22 0834 07/27/22 2048 07/28/22 0525 07/29/22 2204 07/30/22 0556  AST 15  --   --   --   --   ALT 14  --   --   --   --   ALKPHOS 56  --   --   --   --   BILITOT 0.6  --   --   --   --   PROT 6.9  --   --   --   --   ALBUMIN 3.3* 3.4* 3.3* 3.1* 3.0*   No results for input(s): "LIPASE", "AMYLASE" in the last 168 hours. No results for input(s): "AMMONIA" in the last 168 hours. CBC: No results for input(s): "WBC", "NEUTROABS", "HGB", "HCT", "MCV", "PLT" in the last 168 hours. Cardiac Enzymes: No results for input(s): "CKTOTAL", "CKMB", "CKMBINDEX", "TROPONINI" in the last 168 hours. BNP: Invalid input(s): "POCBNP" CBG: No results for input(s): "GLUCAP" in the last 168 hours. D-Dimer No results for input(s): "DDIMER" in the last 72 hours. Hgb A1c No results for input(s): "HGBA1C" in the last 72 hours. Lipid Profile No results for input(s): "CHOL", "HDL", "LDLCALC", "TRIG", "CHOLHDL", "LDLDIRECT" in the last 72 hours. Thyroid function studies No results for input(s): "TSH", "T4TOTAL", "T3FREE", "THYROIDAB" in the last 72 hours.  Invalid input(s): "FREET3" Anemia work up No results for input(s): "VITAMINB12", "FOLATE", "FERRITIN", "TIBC", "IRON", "RETICCTPCT" in the last 72 hours. Urinalysis    Component Value Date/Time   COLORURINE YELLOW 07/24/2022 0240   APPEARANCEUR CLEAR 07/24/2022 0240   LABSPEC 1.017 07/24/2022 0240   PHURINE 5.0 07/24/2022 0240  GLUCOSEU NEGATIVE 07/24/2022 0240   HGBUR  NEGATIVE 07/24/2022 0240   BILIRUBINUR NEGATIVE 07/24/2022 0240   KETONESUR 80 (A) 07/24/2022 0240   PROTEINUR NEGATIVE 07/24/2022 0240   UROBILINOGEN 1.0 02/10/2009 2245   NITRITE NEGATIVE 07/24/2022 0240   LEUKOCYTESUR NEGATIVE 07/24/2022 0240   Sepsis Labs No results for input(s): "WBC" in the last 168 hours.  Invalid input(s): "PROCALCITONIN", "LACTICIDVEN" Microbiology No results found for this or any previous visit (from the past 240 hour(s)).   Time coordinating discharge: 35 minutes  SIGNED:   Erick Blinks, DO [facility] 07/31/2022, 10:23 AM  If 7PM-7AM, please contact night-coverage [email or web address]

## 2022-07-31 NOTE — TOC Progression Note (Signed)
Transition of Care [facility]) - Progression Note    Patient Details  Name: Kristi King MRN: [id number] Date of Birth: August 28, 1980  Transition of Care [facility]) CM/SW Contact  Moiz Ryant A [name], [place] Phone Number: 07/31/2022, 10:38 AM  Clinical Narrative:     CSW contacted [name], pt's caretaker, who stated that she would be able to provide transportation for pt today.   CSW informed her the nursing will contact her when pt is ready to be picked up.   CSW said that [facility] PT has been coordinated with [facility] and she said that she had received phone call from them already and confirmed that they are scheduled.    No other TOC needs identified at this time. Please consult TOC if other needs arise.        Barriers to Discharge: Continued Medical Work up  Expected Discharge Plan and Services   Discharge Planning Services: CM Consult Post Acute Care Choice: Skilled Nursing Facility Living arrangements for the past 2 months: Group Home Expected Discharge Date: 07/31/22                                     Social Determinants of Health (SDOH) Interventions SDOH Screenings   Food Insecurity: No Food Insecurity (07/24/2022)  Housing: Low Risk  (07/24/2022)  Transportation Needs: No Transportation Needs (07/24/2022)  Utilities: Not At Risk (07/24/2022)  Social Connections: Unknown (05/21/2022)   Received from [facility]  Tobacco Use: Low Risk  (07/26/2022)    Readmission Risk Interventions    07/27/2022   12:08 PM 07/23/2022   12:32 PM  Readmission Risk Prevention Plan  Post Dischage Appt Complete Complete  Medication Screening Complete Complete  Transportation Screening Complete Complete

## 2023-08-01 ENCOUNTER — Other Ambulatory Visit: Payer: Self-pay

## 2023-08-18 ENCOUNTER — Encounter (HOSPITAL_BASED_OUTPATIENT_CLINIC_OR_DEPARTMENT_OTHER): Payer: Self-pay

## 2023-08-18 ENCOUNTER — Emergency Department (HOSPITAL_BASED_OUTPATIENT_CLINIC_OR_DEPARTMENT_OTHER)

## 2023-08-18 ENCOUNTER — Emergency Department (HOSPITAL_BASED_OUTPATIENT_CLINIC_OR_DEPARTMENT_OTHER): Admission: EM | Admit: 2023-08-18 | Discharge: 2023-08-18 | Disposition: A | Discharge status: Home / Self Care

## 2023-08-18 ENCOUNTER — Other Ambulatory Visit (HOSPITAL_BASED_OUTPATIENT_CLINIC_OR_DEPARTMENT_OTHER): Payer: Self-pay

## 2023-08-18 DIAGNOSIS — R42 Dizziness and giddiness: Secondary | ICD-10-CM | POA: Diagnosis present

## 2023-08-18 LAB — CBC WITH DIFFERENTIAL/PLATELET
Abs Immature Granulocytes: 0.01 K/uL (ref 0.00–0.07)
Basophils Absolute: 0.1 K/uL (ref 0.0–0.1)
Basophils Relative: 1 %
Eosinophils Absolute: 0.1 K/uL (ref 0.0–0.5)
Eosinophils Relative: 1 %
HCT: 46.6 % — ABNORMAL HIGH (ref 36.0–46.0)
Hemoglobin: 15.4 g/dL — ABNORMAL HIGH (ref 12.0–15.0)
Immature Granulocytes: 0 %
Lymphocytes Relative: 30 %
Lymphs Abs: 2 K/uL (ref 0.7–4.0)
MCH: 29.9 pg (ref 26.0–34.0)
MCHC: 33 g/dL (ref 30.0–36.0)
MCV: 90.5 fL (ref 80.0–100.0)
Monocytes Absolute: 0.4 K/uL (ref 0.1–1.0)
Monocytes Relative: 6 %
Neutro Abs: 4.1 K/uL (ref 1.7–7.7)
Neutrophils Relative %: 62 %
Platelets: 253 K/uL (ref 150–400)
RBC: 5.15 MIL/uL — ABNORMAL HIGH (ref 3.87–5.11)
RDW: 13.2 % (ref 11.5–15.5)
WBC: 6.6 K/uL (ref 4.0–10.5)
nRBC: 0 % (ref 0.0–0.2)

## 2023-08-18 LAB — BASIC METABOLIC PANEL WITH GFR
Anion gap: 12 (ref 5–15)
BUN: 13 mg/dL (ref 6–20)
CO2: 21 mmol/L — ABNORMAL LOW (ref 22–32)
Calcium: 10.2 mg/dL (ref 8.9–10.3)
Chloride: 107 mmol/L (ref 98–111)
Creatinine, Ser: 0.89 mg/dL (ref 0.44–1.00)
GFR, Estimated: >60 mL/min (ref >60)
Glucose, Bld: 84 mg/dL (ref 70–99)
Potassium: 4.2 mmol/L (ref 3.5–5.1)
Sodium: 140 mmol/L (ref 135–145)

## 2023-08-18 MED ORDER — MECLIZINE HCL 25 MG PO TABS
25.0000 mg | ORAL_TABLET | Freq: Three times a day (TID) | ORAL | 0 refills | Status: DC | PRN
  Filled 2023-08-18: qty 30, 10d supply, fill #0

## 2023-08-18 MED ORDER — MECLIZINE HCL 25 MG PO TABS: 25.0000 mg | ORAL_TABLET | Freq: Three times a day (TID) | ORAL | 0 refills | Status: AC | PRN

## 2023-08-18 MED ORDER — MECLIZINE HCL 25 MG PO TABS
25.0000 mg | ORAL_TABLET | Freq: Once | ORAL | Status: AC
  Administered 2023-08-18: 25 mg via ORAL
  Filled 2023-08-18: qty 1
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
## 2023-08-18 NOTE — Discharge Instructions (Signed)
 You can take your meclizine  as needed for dizziness.  Follow-up with primary care.

## 2023-08-18 NOTE — ED Provider Notes (Signed)
 [place] EMERGENCY DEPARTMENT AT [facility] Provider Note   CSN: [id number] Arrival date & time: 08/18/23  1102     Patient presents with: Dizziness   Kristi King is a 43 y.o. female.   42 year old female presents for evaluation of dizziness.  Was sent here from urgent care.  Has a history of developmental delay and is accompanied by caregiver and helps to write history.  Patient states she feels dizzy like the room is spinning.  States it has been off and on for the last couple days.  She denies any nausea or vomiting.  States she has an occasional headache as well.  Caregiver states she has no history of vertigo.  Patient denies any other symptoms or concerns at this time.   Dizziness Associated symptoms: no chest pain, no palpitations, no shortness of breath and no vomiting        Prior to Admission medications   Medication Sig Start Date End Date Taking? Authorizing Provider  ARIPiprazole  (ABILIFY ) 15 MG tablet Take 1 tablet (15 mg total) by mouth daily. 07/31/22 08/30/22  Maree, Pratik D, DO  cetirizine (ZYRTEC) 10 MG tablet Take 10 mg by mouth daily. 09/11/16   [provider]  Cholecalciferol 50 MCG (2000 UT) TABS Take 2,000 Units by mouth daily.    [provider]  ciclopirox (PENLAC) 8 % solution Apply 1 Application topically daily. Remove after 7 days.    [provider]  dexlansoprazole (DEXILANT) 60 MG capsule Take 60 mg by mouth daily.    [provider]  gabapentin (NEURONTIN) 300 MG capsule Take 300 mg by mouth daily.    [provider]  gabapentin (NEURONTIN) 400 MG capsule Take 400 mg by mouth at bedtime.    [provider]  gemfibrozil  (LOPID ) 600 MG tablet Take 600 mg by mouth 2 (two) times daily before a meal.    [provider]  lacosamide  (VIMPAT ) 10 MG/ML oral solution Take 20 mLs (200 mg total) by mouth 2 (two) times daily. Take (200mg  total) via G tube twice daily. 07/31/22   Maree,  Pratik D, DO  levETIRAcetam  (KEPPRA ) 100 MG/ML solution Take 15 mLs (1,500 mg total) by mouth 2 (two) times daily. 07/31/22   Maree, Pratik D, DO  meclizine  (ANTIVERT ) 25 MG tablet Take 1 tablet (25 mg total) by mouth 3 (three) times daily as needed for dizziness. 08/18/23   [name], DO  Melatonin 10 MG TABS Take 10 mg by mouth at bedtime.    [provider]  mirtazapine (REMERON) 15 MG tablet Take 15 mg by mouth daily.    [provider]  potassium chloride  SA (KLOR-CON  M) 20 MEQ tablet Take 1 tablet (20 mEq total) by mouth daily. 07/31/22 08/30/22  Maree, Pratik D, DO  Propylene Glycol (SYSTANE COMPLETE OP) Apply 1 drop to eye as needed (Dry eye).    [provider]  zonisamide  (ZONEGRAN ) 100 MG capsule Take 100 mg by mouth 2 (two) times daily.    [provider]    Allergies: Dilantin [phenytoin] and Tegretol [carbamazepine]    Review of Systems  Constitutional:  Negative for chills and fever.  HENT:  Negative for ear pain and sore throat.   Eyes:  Negative for pain and visual disturbance.  Respiratory:  Negative for cough and shortness of breath.   Cardiovascular:  Negative for chest pain and palpitations.  Gastrointestinal:  Negative for abdominal pain and vomiting.  Genitourinary:  Negative for dysuria  and hematuria.  Musculoskeletal:  Negative for arthralgias and back pain.  Skin:  Negative for color change and rash.  Neurological:  Positive for dizziness. Negative for seizures and syncope.  All other systems reviewed and are negative.   Updated Vital Signs BP 121/72   Pulse (!) 49   Temp 98.3 F (36.8 C)   Resp 17   SpO2 100%   Physical Exam Vitals and nursing note reviewed.  Constitutional:      General: She is not in acute distress.    Appearance: Normal appearance. She is well-developed. She is not ill-appearing.  HENT:     Head: Normocephalic and atraumatic.  Eyes:     Extraocular Movements: Extraocular movements intact.      Conjunctiva/sclera: Conjunctivae normal.  Cardiovascular:     Rate and Rhythm: Normal rate and regular rhythm.     Heart sounds: No murmur heard. Pulmonary:     Effort: Pulmonary effort is normal. No respiratory distress.     Breath sounds: Normal breath sounds.  Abdominal:     Palpations: Abdomen is soft.     Tenderness: There is no abdominal tenderness.  Musculoskeletal:        General: No swelling.     Cervical back: Normal range of motion and neck supple.  Skin:    General: Skin is warm and dry.     Capillary Refill: Capillary refill takes less than 2 seconds.  Neurological:     General: No focal deficit present.     Mental Status: She is alert.  Psychiatric:        Mood and Affect: Mood normal.     (all labs ordered are listed, but only abnormal results are displayed) Labs Reviewed  BASIC METABOLIC PANEL WITH GFR - Abnormal; Notable for the following components:      Result Value   CO2 21 (*)    All other components within normal limits  CBC WITH DIFFERENTIAL/PLATELET - Abnormal; Notable for the following components:   RBC 5.15 (*)    Hemoglobin 15.4 (*)    HCT 46.6 (*)    All other components within normal limits    EKG: EKG Interpretation Date/Time:  Sunday August 18 2023 11:32:52 EDT Ventricular Rate:  53 PR Interval:  137 QRS Duration:  91 QT Interval:  409 QTC Calculation: 384 R Axis:   37  Text Interpretation: Sinus rhythm Normal intervals No acute changes when compared to previous EKG from 07/21/2022 Confirmed by Gennaro Bouchard ([id number]) on 08/18/2023 11:45:48 AM  Radiology: CT Head Wo Contrast Result Date: 08/18/2023 EXAM: CT HEAD WITHOUT CONTRAST 08/18/2023 12:15:00 PM TECHNIQUE: CT of the head was performed without the administration of intravenous contrast. Automated exposure control, iterative reconstruction, and/or weight based adjustment of the mA/kV was utilized to reduce the radiation dose to as low as reasonably achievable. COMPARISON: None  available. CLINICAL HISTORY: Headache, dizzy. Fall on Friday, denies LOC. Increased weakness x 1 week. FINDINGS: BRAIN AND VENTRICLES: No acute hemorrhage. Gray-white differentiation is preserved. No hydrocephalus. No extra-axial collection. No mass effect or midline shift. ORBITS: No acute abnormality. SINUSES: No acute abnormality. SOFT TISSUES AND SKULL: No acute soft tissue abnormality. No skull fracture. IMPRESSION: 1. No acute intracranial abnormality. Electronically signed by: Donnice Mania MD 08/18/2023 12:25 PM EDT RP Workstation: [id number]     Procedures   Medications Ordered in the ED  meclizine  (ANTIVERT ) tablet 25 mg (25 mg Oral Given 08/18/23 1215)  Medical Decision Making Cardiac monitor: Sinus bradycardia, no ectopy  Social determinants of health: Developmental delay  Patient here for dizziness.  Sounds like peripheral vertigo.  She is feeling better after meclizine .  Vitals are stable and workup is largely negative.  I advised close follow-up with primary care and otherwise return to the ER for new or worsening symptoms.  Patient and her caregiver feel comfortable plan be discharged home.  Problems Addressed: Vertigo: acute illness or injury  Amount and/or Complexity of Data Reviewed External Data Reviewed: notes.    Details: Outpatient records reviewed and patient was seen by urgent care earlier today and sent here for further workup and evaluation Labs: ordered. Decision-making details documented in ED Course.    Details: Ordered and reviewed by me and largely nonactionable, CBC negative, no signs of infection or acute abnormality Radiology: ordered and independent interpretation performed. Decision-making details documented in ED Course.    Details: Imaging ordered and interpreted by me independently of radiology CT head: Shows no acute abnormality ECG/medicine tests: ordered and independent interpretation performed. Decision-making  details documented in ED Course.    Details: Ordered and interpreted by me in the absence of cardiology and shows no STEMI, sinus bradycardia, no acute abnormality otherwise  Risk OTC drugs. Prescription drug management. Diagnosis or treatment significantly limited by social determinants of health.     Final diagnoses:  Vertigo    ED Discharge Orders          Ordered    meclizine  (ANTIVERT ) 25 MG tablet  3 times daily PRN,   Status:  Discontinued        08/18/23 1306    meclizine  (ANTIVERT ) 25 MG tablet  3 times daily PRN        08/18/23 1325               Ahniyah Giancola L, DO 08/18/23 1530

## 2023-08-18 NOTE — ED Triage Notes (Signed)
 Patient reports dizziness since Friday. She has had a headache since last night. She was seen today at urgent care and was discharged as they found no findings.
